# Patient Record
Sex: Male | Born: 1955 | Race: White | Hispanic: No | Marital: Married | Smoking: Never smoker
Health system: Southern US, Community
[De-identification: ages and names within clinical notes are randomized; demographics above are authoritative.]

## PROBLEM LIST (undated history)

## (undated) DIAGNOSIS — E785 Hyperlipidemia, unspecified: Secondary | ICD-10-CM

## (undated) DIAGNOSIS — I10 Essential (primary) hypertension: Secondary | ICD-10-CM

## (undated) DIAGNOSIS — G4730 Sleep apnea, unspecified: Secondary | ICD-10-CM

## (undated) HISTORY — PX: COLONOSCOPY: SHX174

## (undated) HISTORY — PX: ADENOIDECTOMY: SUR15

---

## 1999-05-17 ENCOUNTER — Encounter: Payer: Self-pay | Admitting: Emergency Medicine

## 1999-05-17 ENCOUNTER — Ambulatory Visit (HOSPITAL_COMMUNITY): Admission: RE | Admit: 1999-05-17 | Discharge: 1999-05-17 | Payer: Self-pay | Admitting: Emergency Medicine

## 2002-08-26 ENCOUNTER — Encounter: Admission: RE | Admit: 2002-08-26 | Discharge: 2002-08-26 | Payer: Self-pay | Admitting: Emergency Medicine

## 2002-08-26 ENCOUNTER — Encounter: Payer: Self-pay | Admitting: Emergency Medicine

## 2003-10-08 ENCOUNTER — Emergency Department (HOSPITAL_COMMUNITY): Admission: AD | Admit: 2003-10-08 | Discharge: 2003-10-08 | Payer: Self-pay | Admitting: Family Medicine

## 2004-05-30 ENCOUNTER — Encounter: Admission: RE | Admit: 2004-05-30 | Discharge: 2004-05-30 | Payer: Self-pay | Admitting: Emergency Medicine

## 2004-06-14 ENCOUNTER — Encounter: Admission: RE | Admit: 2004-06-14 | Discharge: 2004-06-14 | Payer: Self-pay | Admitting: Emergency Medicine

## 2004-07-20 ENCOUNTER — Encounter: Admission: RE | Admit: 2004-07-20 | Discharge: 2004-08-30 | Payer: Self-pay | Admitting: Orthopedic Surgery

## 2005-04-18 ENCOUNTER — Encounter: Admission: RE | Admit: 2005-04-18 | Discharge: 2005-07-17 | Payer: Self-pay | Admitting: Orthopedic Surgery

## 2005-06-18 ENCOUNTER — Encounter: Admission: RE | Admit: 2005-06-18 | Discharge: 2005-06-18 | Payer: Self-pay | Admitting: Emergency Medicine

## 2008-01-27 ENCOUNTER — Encounter: Admission: RE | Admit: 2008-01-27 | Discharge: 2008-02-26 | Payer: Self-pay | Admitting: Emergency Medicine

## 2008-02-17 ENCOUNTER — Encounter: Admission: RE | Admit: 2008-02-17 | Discharge: 2008-02-17 | Payer: Self-pay | Admitting: Emergency Medicine

## 2009-03-25 ENCOUNTER — Ambulatory Visit (HOSPITAL_COMMUNITY): Admission: RE | Admit: 2009-03-25 | Discharge: 2009-03-25 | Payer: Self-pay | Admitting: Family Medicine

## 2011-01-07 ENCOUNTER — Encounter: Payer: Self-pay | Admitting: Emergency Medicine

## 2012-01-21 ENCOUNTER — Ambulatory Visit
Admission: RE | Admit: 2012-01-21 | Discharge: 2012-01-21 | Disposition: A | Discharge status: Home / Self Care | Payer: BC Managed Care – PPO | Source: Ambulatory Visit | Attending: Family Medicine | Admitting: Family Medicine

## 2012-01-21 ENCOUNTER — Other Ambulatory Visit: Payer: Self-pay | Admitting: Family Medicine

## 2012-01-21 DIAGNOSIS — R109 Unspecified abdominal pain: Secondary | ICD-10-CM

## 2012-01-22 ENCOUNTER — Other Ambulatory Visit: Payer: Self-pay | Admitting: Family Medicine

## 2012-01-22 DIAGNOSIS — R1011 Right upper quadrant pain: Secondary | ICD-10-CM

## 2012-01-24 ENCOUNTER — Ambulatory Visit
Admission: RE | Admit: 2012-01-24 | Discharge: 2012-01-24 | Disposition: A | Discharge status: Home / Self Care | Payer: BC Managed Care – PPO | Source: Ambulatory Visit | Attending: Family Medicine | Admitting: Family Medicine

## 2012-01-24 DIAGNOSIS — R1011 Right upper quadrant pain: Secondary | ICD-10-CM

## 2012-10-16 ENCOUNTER — Ambulatory Visit: Payer: BC Managed Care – PPO | Attending: Orthopedic Surgery | Admitting: Physical Therapy

## 2012-10-16 ENCOUNTER — Ambulatory Visit: Payer: BC Managed Care – PPO | Admitting: Physical Therapy

## 2012-10-16 DIAGNOSIS — IMO0001 Reserved for inherently not codable concepts without codable children: Secondary | ICD-10-CM | POA: Insufficient documentation

## 2012-10-16 DIAGNOSIS — M25669 Stiffness of unspecified knee, not elsewhere classified: Secondary | ICD-10-CM | POA: Insufficient documentation

## 2012-10-16 DIAGNOSIS — M25569 Pain in unspecified knee: Secondary | ICD-10-CM | POA: Insufficient documentation

## 2012-10-20 ENCOUNTER — Ambulatory Visit: Payer: BC Managed Care – PPO | Admitting: Physical Therapy

## 2013-03-22 ENCOUNTER — Encounter (HOSPITAL_COMMUNITY): Payer: Self-pay | Admitting: Emergency Medicine

## 2013-03-22 ENCOUNTER — Emergency Department (HOSPITAL_COMMUNITY)
Admission: EM | Admit: 2013-03-22 | Discharge: 2013-03-22 | Disposition: A | Discharge status: Home / Self Care | Payer: BC Managed Care – PPO | Attending: Emergency Medicine | Admitting: Emergency Medicine

## 2013-03-22 DIAGNOSIS — G4733 Obstructive sleep apnea (adult) (pediatric): Secondary | ICD-10-CM | POA: Insufficient documentation

## 2013-03-22 DIAGNOSIS — J029 Acute pharyngitis, unspecified: Secondary | ICD-10-CM

## 2013-03-22 DIAGNOSIS — Z79899 Other long term (current) drug therapy: Secondary | ICD-10-CM | POA: Insufficient documentation

## 2013-03-22 DIAGNOSIS — Z7982 Long term (current) use of aspirin: Secondary | ICD-10-CM | POA: Insufficient documentation

## 2013-03-22 DIAGNOSIS — E785 Hyperlipidemia, unspecified: Secondary | ICD-10-CM | POA: Insufficient documentation

## 2013-03-22 DIAGNOSIS — I10 Essential (primary) hypertension: Secondary | ICD-10-CM | POA: Insufficient documentation

## 2013-03-22 DIAGNOSIS — R509 Fever, unspecified: Secondary | ICD-10-CM | POA: Insufficient documentation

## 2013-03-22 HISTORY — DX: Essential (primary) hypertension: I10

## 2013-03-22 HISTORY — DX: Sleep apnea, unspecified: G47.30

## 2013-03-22 HISTORY — DX: Hyperlipidemia, unspecified: E78.5

## 2013-03-22 LAB — RAPID STREP SCREEN (MED CTR MEBANE ONLY): Streptococcus, Group A Screen (Direct): NEGATIVE

## 2013-03-22 MED ORDER — ACETAMINOPHEN 500 MG PO TABS
1000.0000 mg | ORAL_TABLET | Freq: Once | ORAL | Status: AC
  Administered 2013-03-22: 1000 mg via ORAL
  Filled 2013-03-22: qty 2

## 2013-03-22 NOTE — ED Notes (Signed)
Pt discharged to home with family. NAD.  

## 2013-03-22 NOTE — ED Notes (Signed)
Pt presents to ED today with c/o sore throat that started last night. NAD.

## 2013-03-22 NOTE — ED Provider Notes (Signed)
History     CSN: [id number]  Arrival date & time 03/22/13  0741   First MD Initiated Contact with Patient 03/22/13 [phone]      Chief Complaint  Patient presents with  . Sore Throat    (Consider location/radiation/quality/duration/timing/severity/associated sxs/prior treatment) Patient is a 57 y.o. male presenting with pharyngitis. The history is provided by the patient. No language interpreter was used.  Sore Throat Associated symptoms include a fever ( 100.4 last night) and a sore throat. Pertinent negatives include no chest pain, chills, congestion, coughing, diaphoresis, fatigue, nausea, neck pain, rash or vomiting.  Pt states he developed a sore throat yesterday.  States it hurts to swallow food.  Took his temp twice yesterday, once was normal, the 2nd time it was 100.4.  Did take 2 tablets of ibuprofen twice yesterday with some relief.  Pain began at 6/10 backed off to 2/10, now back to 4/10 today.  Pt was able to eat and drink just fine last night.  States throat pain kept him up last night.  Denies difficulty breathing or swallowing, just painful to swallow solids.  Also c/o mild headache.  Denies n/v/d. Mentions some people have been sick at work recently.  No PMH of seasonal allergies or asthma.    Past Medical History  Diagnosis Date  . Hypertension   . Sleep apnea   . Hyperlipemia     Past Surgical History  Procedure Laterality Date  . Adnoids      History reviewed. No pertinent family history.  History  Substance Use Topics  . Smoking status: Not on file  . Smokeless tobacco: Not on file  . Alcohol Use: Yes     Comment: 5 days a week liquor      Review of Systems  Constitutional: Positive for fever ( 100.4 last night). Negative for chills, diaphoresis and fatigue.  HENT: Positive for sore throat and sneezing. Negative for ear pain, congestion, rhinorrhea, drooling, neck pain, voice change, postnasal drip and sinus pressure.   Eyes: Negative for redness and  itching.  Respiratory: Negative for cough, chest tightness, shortness of breath and stridor.   Cardiovascular: Negative for chest pain.  Gastrointestinal: Negative for nausea, vomiting and diarrhea.  Skin: Negative for rash.    Allergies  Review of patient's allergies indicates no known allergies.  Home Medications   Current Outpatient Rx  Name  Route  Sig  Dispense  Refill  . amLODipine (NORVASC) 10 MG tablet   Oral   Take 10 mg by mouth daily.         Marland Kitchen aspirin 81 MG tablet   Oral   Take 81 mg by mouth every evening.         Marland Kitchen atenolol (TENORMIN) 25 MG tablet   Oral   Take 25 mg by mouth daily.         . Fiber CAPS   Oral   Take 4 capsules by mouth every evening.         . fish oil-omega-3 fatty acids 1000 MG capsule   Oral   Take 2 g by mouth daily.         Marland Kitchen losartan (COZAAR) 100 MG tablet   Oral   Take 100 mg by mouth daily.         . pravastatin (PRAVACHOL) 40 MG tablet   Oral   Take 40 mg by mouth daily.         . SAW PALMETTO, SERENOA REPENS, PO   Oral  Take 1 tablet by mouth every evening.           BP 146/75  Pulse 67  Temp(Src) 98.4 F (36.9 C) (Oral)  Ht 6\' 2"  (1.88 m)  Wt 270 lb (122.471 kg)  BMI 34.65 kg/m2  SpO2 100%  Physical Exam  Nursing note and vitals reviewed. Constitutional: He appears well-developed and well-nourished. No distress.  HENT:  Head: Normocephalic and atraumatic.  Mouth/Throat: Uvula is midline and mucous membranes are normal. Posterior oropharyngeal edema and posterior oropharyngeal erythema present. No oropharyngeal exudate or tonsillar abscesses.  Eyes: Conjunctivae are normal. No scleral icterus.  Neck: Normal range of motion. Neck supple.  Cardiovascular: Normal rate and regular rhythm.   Pulmonary/Chest: Effort normal and breath sounds normal. No respiratory distress. He has no wheezes. He has no rales. He exhibits no tenderness.  Abdominal: Soft. Bowel sounds are normal. He exhibits no  distension. There is no tenderness.  Musculoskeletal: Normal range of motion.  Lymphadenopathy:    He has cervical adenopathy (left anterior cervical  ).  Neurological: He is alert.  Skin: Skin is warm and dry. He is not diaphoretic.  Psychiatric: He has a normal mood and affect. His behavior is normal.    ED Course  Procedures (including critical care time)  Labs Reviewed  RAPID STREP SCREEN   No results found.   1. Acute viral pharyngitis       MDM  Pt c/o sore throat, was 6/10 yesterday, down to a 4/10 today, also c/o mild headache and reported fever of 100.4 last night.  Ibuprofen yesterday did help.  Will obtain rapid strep.   Gave pt acetaminophen in ED for pain.   Rapid strep: negative   Believe this is viral pharyngitis.  Will have pt take OTC acetaminophen or ibuprofen.  Also suggested salt water gargle and chloraseptic spray.  Provider pt with [facility] contact info to establish care with PCP.  F/u with PCP or urgent care if not improving in next few days.  Return to ED if unable to swallow liquids or solids or if difficulty breathing.  Vitals: unremarkable. Discharged in stable condition.    Discussed pt with attending during ED encounter.         Junius Finner, PA-C 03/22/13 [phone]

## 2013-03-25 NOTE — ED Provider Notes (Signed)
Medical screening examination/treatment/procedure(s) were performed by non-physician practitioner and as supervising physician I was immediately available for consultation/collaboration.   Suzi Roots, MD 03/25/13 2139

## 2015-02-25 ENCOUNTER — Other Ambulatory Visit: Payer: Self-pay | Admitting: Family Medicine

## 2015-02-25 DIAGNOSIS — R1011 Right upper quadrant pain: Secondary | ICD-10-CM

## 2015-03-25 ENCOUNTER — Ambulatory Visit
Admission: RE | Admit: 2015-03-25 | Discharge: 2015-03-25 | Disposition: A | Discharge status: Home / Self Care | Payer: BC Managed Care – PPO | Source: Ambulatory Visit | Attending: Family Medicine | Admitting: Family Medicine

## 2015-03-25 ENCOUNTER — Other Ambulatory Visit: Payer: Self-pay | Admitting: Family Medicine

## 2015-03-25 DIAGNOSIS — R1011 Right upper quadrant pain: Secondary | ICD-10-CM

## 2016-10-08 ENCOUNTER — Other Ambulatory Visit: Payer: Self-pay | Admitting: Family Medicine

## 2016-10-08 DIAGNOSIS — M5442 Lumbago with sciatica, left side: Secondary | ICD-10-CM

## 2016-10-12 ENCOUNTER — Ambulatory Visit
Admission: RE | Admit: 2016-10-12 | Discharge: 2016-10-12 | Disposition: A | Discharge status: Home / Self Care | Payer: BC Managed Care – PPO | Source: Ambulatory Visit | Attending: Family Medicine | Admitting: Family Medicine

## 2016-10-12 DIAGNOSIS — M5442 Lumbago with sciatica, left side: Secondary | ICD-10-CM

## 2016-10-17 ENCOUNTER — Other Ambulatory Visit: Payer: BC Managed Care – PPO

## 2016-10-20 ENCOUNTER — Other Ambulatory Visit: Payer: BC Managed Care – PPO

## 2017-09-13 ENCOUNTER — Other Ambulatory Visit: Payer: Self-pay | Admitting: Neurological Surgery

## 2017-10-07 ENCOUNTER — Encounter (HOSPITAL_COMMUNITY): Payer: Self-pay

## 2017-10-07 NOTE — Pre-Procedure Instructions (Signed)
Douglas BaileyJoseph Roberts  10/07/2017      [id number] - [place], [place] - [phone] [place] [address] Phone: [phone] Fax: [phone]    Your procedure is scheduled on Tuesday October 30.  [facility] at 10:00 A.M.  Call this number if you have problems the morning of surgery:  [phone]   Remember:  Do not eat food or drink liquids after midnight.  CONTINUE TAKING all medications as prescribed unless instructed otherwise.    Take these medicines the morning of surgery with A SIP OF WATER: NONE  7 days prior to surgery STOP taking any Aspirin (unless otherwise instructed by your surgeon), Aleve, Naproxen, Ibuprofen, Motrin, Advil, Goody's, BC's, all herbal medications, fish oil, and all vitamins    Do not wear jewelry, make-up or nail polish.  Do not wear lotions, powders, or perfumes, or deoderant.  Do not shave 48 hours prior to surgery.  Men may shave face and neck.  Do not bring valuables to the hospital.  [facility] is not responsible for any belongings or valuables.  Contacts, dentures or bridgework may not be worn into surgery.  Leave your suitcase in the car.  After surgery it may be brought to your room.  For patients admitted to the hospital, discharge time will be determined by your treatment team.  Patients discharged the day of surgery will not be allowed to drive home.   Special instructions:    [facility]- Preparing For Surgery  Before surgery, you can play an important role. Because skin is not sterile, your skin needs to be as free of germs as possible. You can reduce the number of germs on your skin by washing with CHG (chlorahexidine gluconate) Soap before surgery.  CHG is an antiseptic cleaner which kills germs and bonds with the skin to continue killing germs even after washing.  Please do not use if you have an allergy to CHG or antibacterial soaps. If your skin becomes reddened/irritated  stop using the CHG.  Do not shave (including legs and underarms) for at least 48 hours prior to first CHG shower. It is OK to shave your face.  Please follow these instructions carefully.   1. Shower the NIGHT BEFORE SURGERY and the MORNING OF SURGERY with CHG.   2. If you chose to wash your hair, wash your hair first as usual with your normal shampoo.  3. After you shampoo, rinse your hair and body thoroughly to remove the shampoo.  4. Use CHG as you would any other liquid soap. You can apply CHG directly to the skin and wash gently with a scrungie or a clean washcloth.   5. Apply the CHG Soap to your body ONLY FROM THE NECK DOWN.  Do not use on open wounds or open sores. Avoid contact with your eyes, ears, mouth and genitals (private parts). Wash Face and genitals (private parts)  with your normal soap.  6. Wash thoroughly, paying special attention to the area where your surgery will be performed.  7. Thoroughly rinse your body with warm water from the neck down.  8. DO NOT shower/wash with your normal soap after using and rinsing off the CHG Soap.  9. Pat yourself dry with a CLEAN TOWEL.  10. Wear CLEAN PAJAMAS to bed the night before surgery, wear comfortable clothes the morning of surgery  11. Place CLEAN SHEETS on your bed the night of your first shower and DO NOT SLEEP  WITH PETS.    Day of Surgery: Do not apply any deodorants/lotions. Please wear clean clothes to the hospital/surgery center.      Please read over the following fact sheets that you were given. Coughing and Deep Breathing and MRSA Information

## 2017-10-08 ENCOUNTER — Encounter (HOSPITAL_COMMUNITY): Payer: Self-pay

## 2017-10-08 ENCOUNTER — Encounter (HOSPITAL_COMMUNITY)
Admission: RE | Admit: 2017-10-08 | Discharge: 2017-10-08 | Disposition: A | Discharge status: Home / Self Care | Payer: BC Managed Care – PPO | Source: Ambulatory Visit | Attending: Neurological Surgery | Admitting: Neurological Surgery

## 2017-10-08 DIAGNOSIS — E785 Hyperlipidemia, unspecified: Secondary | ICD-10-CM | POA: Diagnosis not present

## 2017-10-08 DIAGNOSIS — Z01812 Encounter for preprocedural laboratory examination: Secondary | ICD-10-CM | POA: Insufficient documentation

## 2017-10-08 DIAGNOSIS — G4733 Obstructive sleep apnea (adult) (pediatric): Secondary | ICD-10-CM | POA: Insufficient documentation

## 2017-10-08 DIAGNOSIS — Z01810 Encounter for preprocedural cardiovascular examination: Secondary | ICD-10-CM | POA: Insufficient documentation

## 2017-10-08 DIAGNOSIS — I10 Essential (primary) hypertension: Secondary | ICD-10-CM | POA: Insufficient documentation

## 2017-10-08 DIAGNOSIS — M48062 Spinal stenosis, lumbar region with neurogenic claudication: Secondary | ICD-10-CM | POA: Diagnosis not present

## 2017-10-08 LAB — CBC
HEMATOCRIT: 45.2 % (ref 39.0–52.0)
HEMOGLOBIN: 15.6 g/dL (ref 13.0–17.0)
MCH: 32.8 pg (ref 26.0–34.0)
MCHC: 34.5 g/dL (ref 30.0–36.0)
MCV: 95.2 fL (ref 78.0–100.0)
Platelets: 193 10*3/uL (ref 150–400)
RBC: 4.75 MIL/uL (ref 4.22–5.81)
RDW: 13.1 % (ref 11.5–15.5)
WBC: 6.3 10*3/uL (ref 4.0–10.5)

## 2017-10-08 LAB — BASIC METABOLIC PANEL
ANION GAP: 13 (ref 5–15)
BUN: 8 mg/dL (ref 6–20)
CO2: 24 mmol/L (ref 22–32)
Calcium: 9.9 mg/dL (ref 8.9–10.3)
Chloride: 100 mmol/L — ABNORMAL LOW (ref 101–111)
Creatinine, Ser: 1.1 mg/dL (ref 0.61–1.24)
GLUCOSE: 131 mg/dL — AB (ref 65–99)
POTASSIUM: 4 mmol/L (ref 3.5–5.1)
Sodium: 137 mmol/L (ref 135–145)

## 2017-10-08 LAB — SURGICAL PCR SCREEN
MRSA, PCR: NEGATIVE
Staphylococcus aureus: NEGATIVE

## 2017-10-08 MED ORDER — CHLORHEXIDINE GLUCONATE CLOTH 2 % EX PADS: 6.0000 | MEDICATED_PAD | Freq: Once | CUTANEOUS | Status: DC

## 2017-10-08 NOTE — Progress Notes (Signed)
PCP - Douglas SenegalKevin Little [employer] Cardiologist - denies  EKG - 10/08/2017  Stress Test - [facility], pt reports normal test done 7 years ago  Pt reports having sleep apnea but does not use CPAP machine.   Patient denies shortness of breath, fever, cough and chest pain at PAT appointment   Patient verbalized understanding of instructions that were given to them at the PAT appointment. Patient was also instructed that they will need to review over the PAT instructions again at home before surgery.

## 2017-10-10 NOTE — Progress Notes (Signed)
Anesthesia Chart Review: Patient is a 61 year old male scheduled for bilateral L2-3, L3-4, L4-5 laminectomy/foraminotomy on 10/15/17 by Dr. Barnett AbuHenry Elsner.  History includes never smoker, HTN, HLD, OSA (no CPAP), adenoidectomy. BMI is consistent with obesity.  PCP is Dr. Catha GosselinKevin Little.   Meds include ASA 81 mg, atenolol, doxazosin, fish oil, pravastatin, saw palmetto.  BP (!) 146/83   Temp 36.9 C   Resp 20   Ht 6\' 2"  (1.88 m)   Wt 255 lb 11.2 oz (116 kg)   SpO2 95%   BMI 32.83 kg/m   EKG 10/08/17: NSR, non-specific T wave abnormality. Currently no comparison tracing.   He was referred to cardiologist Dr. Donato SchultzMark Skains (at [facility], now [facility]) in 2011 for abnormal EKG and atypical chest pain. By notes his EKG "showed loss of lateral T waves.Marland Kitchen.Marland Kitchen.Q waves in lead one and aVL which may be indicative of a lateral infarction." He had a nuclear stress test read on 01/18/10 that showed, "Low-risk, no ischemia, no infarct...no evidence of lateral infarction on perfusion imaging. Normal EF." (These records are scanned under the Media tab, 01/12/10.)  Interpreting cardiologist felt T wave changes were non-specific. Previous history of abnormal EKG although I don't currently have an old tracing. He denied SOB, fever, cough, chest pain at PAT. If no acute changes and remains asymptomatic from a CV standpoint then I would anticipate that he can proceed as planned.  Velna Ochsllison Opie Fanton, PA-C [facility]Anesthesiology Phone [phone] 10/10/2017 4:54 PM

## 2017-10-14 MED ORDER — CEFAZOLIN SODIUM-DEXTROSE 2-4 GM/100ML-% IV SOLN
2.0000 g | INTRAVENOUS | Status: AC
  Administered 2017-10-15: 2 g via INTRAVENOUS
  Filled 2017-10-14: qty 100

## 2017-10-15 ENCOUNTER — Encounter (HOSPITAL_COMMUNITY): Payer: Self-pay | Admitting: Certified Registered Nurse Anesthetist

## 2017-10-15 ENCOUNTER — Observation Stay (HOSPITAL_COMMUNITY)
Admission: RE | Admit: 2017-10-15 | Discharge: 2017-10-16 | Disposition: A | Discharge status: Home / Self Care | Payer: BC Managed Care – PPO | Source: Ambulatory Visit | Attending: Neurological Surgery | Admitting: Neurological Surgery

## 2017-10-15 ENCOUNTER — Ambulatory Visit (HOSPITAL_COMMUNITY): Payer: BC Managed Care – PPO | Admitting: Emergency Medicine

## 2017-10-15 ENCOUNTER — Ambulatory Visit (HOSPITAL_COMMUNITY): Payer: BC Managed Care – PPO | Admitting: Certified Registered Nurse Anesthetist

## 2017-10-15 ENCOUNTER — Encounter (HOSPITAL_COMMUNITY)
Admission: RE | Disposition: A | Discharge status: Home / Self Care | Payer: Self-pay | Source: Ambulatory Visit | Attending: Neurological Surgery

## 2017-10-15 ENCOUNTER — Ambulatory Visit (HOSPITAL_COMMUNITY): Payer: BC Managed Care – PPO

## 2017-10-15 DIAGNOSIS — G4730 Sleep apnea, unspecified: Secondary | ICD-10-CM | POA: Diagnosis not present

## 2017-10-15 DIAGNOSIS — R2689 Other abnormalities of gait and mobility: Secondary | ICD-10-CM | POA: Diagnosis not present

## 2017-10-15 DIAGNOSIS — Z419 Encounter for procedure for purposes other than remedying health state, unspecified: Secondary | ICD-10-CM

## 2017-10-15 DIAGNOSIS — Z79899 Other long term (current) drug therapy: Secondary | ICD-10-CM | POA: Insufficient documentation

## 2017-10-15 DIAGNOSIS — I10 Essential (primary) hypertension: Secondary | ICD-10-CM | POA: Diagnosis not present

## 2017-10-15 DIAGNOSIS — Z7982 Long term (current) use of aspirin: Secondary | ICD-10-CM | POA: Diagnosis not present

## 2017-10-15 DIAGNOSIS — M4726 Other spondylosis with radiculopathy, lumbar region: Secondary | ICD-10-CM | POA: Insufficient documentation

## 2017-10-15 DIAGNOSIS — M48062 Spinal stenosis, lumbar region with neurogenic claudication: Secondary | ICD-10-CM | POA: Diagnosis present

## 2017-10-15 HISTORY — PX: LUMBAR LAMINECTOMY/DECOMPRESSION MICRODISCECTOMY: SHX5026

## 2017-10-15 SURGERY — LUMBAR LAMINECTOMY/DECOMPRESSION MICRODISCECTOMY 3 LEVELS
Anesthesia: General | Site: Back | Laterality: Bilateral

## 2017-10-15 MED ORDER — HYDROCODONE-ACETAMINOPHEN 5-325 MG PO TABS
2.0000 | ORAL_TABLET | ORAL | Status: DC | PRN
  Administered 2017-10-15 – 2017-10-16 (×4): 2 via ORAL
  Filled 2017-10-15 (×4): qty 2

## 2017-10-15 MED ORDER — DOCUSATE SODIUM 100 MG PO CAPS
100.0000 mg | ORAL_CAPSULE | Freq: Two times a day (BID) | ORAL | Status: DC
  Administered 2017-10-15 – 2017-10-16 (×2): 100 mg via ORAL
  Filled 2017-10-15 (×2): qty 1

## 2017-10-15 MED ORDER — BUPIVACAINE HCL (PF) 0.5 % IJ SOLN
INTRAMUSCULAR | Status: AC
  Filled 2017-10-15: qty 30

## 2017-10-15 MED ORDER — FENTANYL CITRATE (PF) 100 MCG/2ML IJ SOLN: 25.0000 ug | INTRAMUSCULAR | Status: DC | PRN

## 2017-10-15 MED ORDER — KETOROLAC TROMETHAMINE 30 MG/ML IJ SOLN
INTRAMUSCULAR | Status: DC | PRN
  Administered 2017-10-15: 30 mg via INTRAVENOUS

## 2017-10-15 MED ORDER — DOXAZOSIN MESYLATE 2 MG PO TABS
2.0000 mg | ORAL_TABLET | Freq: Every day | ORAL | Status: DC
  Administered 2017-10-15: 2 mg via ORAL
  Filled 2017-10-15 (×3): qty 1

## 2017-10-15 MED ORDER — KETOROLAC TROMETHAMINE 15 MG/ML IJ SOLN
15.0000 mg | Freq: Four times a day (QID) | INTRAMUSCULAR | Status: DC
Start: 2017-10-15 — End: 2017-10-16
  Administered 2017-10-15 – 2017-10-16 (×3): 15 mg via INTRAVENOUS
  Filled 2017-10-15 (×3): qty 1

## 2017-10-15 MED ORDER — DEXTROSE 5 % IV SOLN
500.0000 mg | Freq: Four times a day (QID) | INTRAVENOUS | Status: DC | PRN
  Filled 2017-10-15: qty 5

## 2017-10-15 MED ORDER — MENTHOL 3 MG MT LOZG: 1.0000 | LOZENGE | OROMUCOSAL | Status: DC | PRN

## 2017-10-15 MED ORDER — FENTANYL CITRATE (PF) 100 MCG/2ML IJ SOLN
INTRAMUSCULAR | Status: DC | PRN
  Administered 2017-10-15 (×2): 50 ug via INTRAVENOUS
  Administered 2017-10-15: 100 ug via INTRAVENOUS
  Administered 2017-10-15: 150 ug via INTRAVENOUS
  Administered 2017-10-15: 50 ug via INTRAVENOUS

## 2017-10-15 MED ORDER — ACETAMINOPHEN 325 MG PO TABS: 650.0000 mg | ORAL_TABLET | ORAL | Status: DC | PRN

## 2017-10-15 MED ORDER — EPHEDRINE SULFATE 50 MG/ML IJ SOLN
INTRAMUSCULAR | Status: DC | PRN
  Administered 2017-10-15 (×2): 10 mg via INTRAVENOUS

## 2017-10-15 MED ORDER — OXYCODONE HCL 5 MG/5ML PO SOLN: 5.0000 mg | Freq: Once | ORAL | Status: DC | PRN

## 2017-10-15 MED ORDER — OXYCODONE HCL 5 MG PO TABS: 5.0000 mg | ORAL_TABLET | Freq: Once | ORAL | Status: DC | PRN

## 2017-10-15 MED ORDER — CEFAZOLIN SODIUM-DEXTROSE 2-4 GM/100ML-% IV SOLN
2.0000 g | Freq: Three times a day (TID) | INTRAVENOUS | Status: AC
  Administered 2017-10-15 – 2017-10-16 (×2): 2 g via INTRAVENOUS
  Filled 2017-10-15 (×2): qty 100

## 2017-10-15 MED ORDER — LIDOCAINE-EPINEPHRINE 1 %-1:100000 IJ SOLN
INTRAMUSCULAR | Status: AC
  Filled 2017-10-15: qty 1

## 2017-10-15 MED ORDER — PHENOL 1.4 % MT LIQD: 1.0000 | OROMUCOSAL | Status: DC | PRN

## 2017-10-15 MED ORDER — MIDAZOLAM HCL 2 MG/2ML IJ SOLN
INTRAMUSCULAR | Status: AC
  Filled 2017-10-15: qty 2

## 2017-10-15 MED ORDER — SUGAMMADEX SODIUM 200 MG/2ML IV SOLN
INTRAVENOUS | Status: DC | PRN
  Administered 2017-10-15: 200 mg via INTRAVENOUS

## 2017-10-15 MED ORDER — SODIUM CHLORIDE 0.9% FLUSH: 3.0000 mL | INTRAVENOUS | Status: DC | PRN

## 2017-10-15 MED ORDER — DEXAMETHASONE SODIUM PHOSPHATE 10 MG/ML IJ SOLN
INTRAMUSCULAR | Status: DC | PRN
  Administered 2017-10-15: 10 mg via INTRAVENOUS

## 2017-10-15 MED ORDER — DOCUSATE SODIUM 100 MG PO CAPS: 100.0000 mg | ORAL_CAPSULE | Freq: Every day | ORAL | Status: DC

## 2017-10-15 MED ORDER — FIBER PO CAPS: 2.0000 | ORAL_CAPSULE | Freq: Two times a day (BID) | ORAL | Status: DC

## 2017-10-15 MED ORDER — FENTANYL CITRATE (PF) 250 MCG/5ML IJ SOLN
INTRAMUSCULAR | Status: AC
  Filled 2017-10-15: qty 5

## 2017-10-15 MED ORDER — POLYETHYLENE GLYCOL 3350 17 G PO PACK: 17.0000 g | PACK | Freq: Every day | ORAL | Status: DC | PRN

## 2017-10-15 MED ORDER — LACTATED RINGERS IV SOLN
INTRAVENOUS | Status: DC
Start: 2017-10-15 — End: 2017-10-16
  Administered 2017-10-15: 13:00:00 via INTRAVENOUS
  Administered 2017-10-15: 50 mL/h via INTRAVENOUS

## 2017-10-15 MED ORDER — PROPOFOL 10 MG/ML IV BOLUS
INTRAVENOUS | Status: DC | PRN
  Administered 2017-10-15: 150 mg via INTRAVENOUS
  Administered 2017-10-15: 50 mg via INTRAVENOUS

## 2017-10-15 MED ORDER — KETAMINE HCL-SODIUM CHLORIDE 100-0.9 MG/10ML-% IV SOSY
PREFILLED_SYRINGE | INTRAVENOUS | Status: AC
  Filled 2017-10-15: qty 10

## 2017-10-15 MED ORDER — ACETAMINOPHEN 10 MG/ML IV SOLN
INTRAVENOUS | Status: AC
  Filled 2017-10-15: qty 100

## 2017-10-15 MED ORDER — KETAMINE HCL 100 MG/ML IJ SOLN
INTRAMUSCULAR | Status: AC
  Filled 2017-10-15: qty 1

## 2017-10-15 MED ORDER — ONDANSETRON HCL 4 MG/2ML IJ SOLN: 4.0000 mg | Freq: Four times a day (QID) | INTRAMUSCULAR | Status: DC | PRN

## 2017-10-15 MED ORDER — LIDOCAINE HCL (CARDIAC) 20 MG/ML IV SOLN
INTRAVENOUS | Status: DC | PRN
  Administered 2017-10-15: 100 mg via INTRAVENOUS

## 2017-10-15 MED ORDER — SODIUM CHLORIDE 0.9% FLUSH
3.0000 mL | Freq: Two times a day (BID) | INTRAVENOUS | Status: DC
Start: 2017-10-15 — End: 2017-10-16
  Administered 2017-10-15: 3 mL via INTRAVENOUS

## 2017-10-15 MED ORDER — HYDROCODONE-ACETAMINOPHEN 5-325 MG PO TABS: 1.0000 | ORAL_TABLET | ORAL | Status: DC | PRN

## 2017-10-15 MED ORDER — PRAVASTATIN SODIUM 40 MG PO TABS
40.0000 mg | ORAL_TABLET | Freq: Every day | ORAL | Status: DC
  Administered 2017-10-15: 40 mg via ORAL
  Filled 2017-10-15: qty 1

## 2017-10-15 MED ORDER — THROMBIN (RECOMBINANT) 5000 UNITS EX SOLR
CUTANEOUS | Status: AC
  Filled 2017-10-15: qty 15000

## 2017-10-15 MED ORDER — 0.9 % SODIUM CHLORIDE (POUR BTL) OPTIME
TOPICAL | Status: DC | PRN
  Administered 2017-10-15: 1000 mL

## 2017-10-15 MED ORDER — THROMBIN (RECOMBINANT) 20000 UNITS EX SOLR
CUTANEOUS | Status: AC
  Filled 2017-10-15: qty 20000

## 2017-10-15 MED ORDER — ROCURONIUM BROMIDE 100 MG/10ML IV SOLN
INTRAVENOUS | Status: DC | PRN
  Administered 2017-10-15: 30 mg via INTRAVENOUS
  Administered 2017-10-15: 70 mg via INTRAVENOUS

## 2017-10-15 MED ORDER — FLEET ENEMA 7-19 GM/118ML RE ENEM: 1.0000 | ENEMA | Freq: Once | RECTAL | Status: DC | PRN

## 2017-10-15 MED ORDER — LIDOCAINE-EPINEPHRINE 1 %-1:100000 IJ SOLN
INTRAMUSCULAR | Status: DC | PRN
  Administered 2017-10-15: 5 mL

## 2017-10-15 MED ORDER — CALCIUM POLYCARBOPHIL 625 MG PO TABS
625.0000 mg | ORAL_TABLET | Freq: Two times a day (BID) | ORAL | Status: DC
  Administered 2017-10-15: 625 mg via ORAL
  Filled 2017-10-15 (×2): qty 1

## 2017-10-15 MED ORDER — KETAMINE HCL 10 MG/ML IJ SOLN
INTRAMUSCULAR | Status: DC | PRN
  Administered 2017-10-15: 60 mg via INTRAVENOUS
  Administered 2017-10-15 (×4): 10 mg via INTRAVENOUS

## 2017-10-15 MED ORDER — DIPHENHYDRAMINE HCL 25 MG PO CAPS
25.0000 mg | ORAL_CAPSULE | Freq: Every evening | ORAL | Status: DC | PRN
  Administered 2017-10-15: 50 mg via ORAL
  Filled 2017-10-15: qty 2

## 2017-10-15 MED ORDER — PROPOFOL 10 MG/ML IV BOLUS
INTRAVENOUS | Status: AC
  Filled 2017-10-15: qty 20

## 2017-10-15 MED ORDER — BACITRACIN 50000 UNITS IM SOLR
INTRAMUSCULAR | Status: DC | PRN
  Administered 2017-10-15: 12:00:00

## 2017-10-15 MED ORDER — ACETAMINOPHEN 160 MG/5ML PO SOLN: 325.0000 mg | ORAL | Status: DC | PRN

## 2017-10-15 MED ORDER — ACETAMINOPHEN 650 MG RE SUPP: 650.0000 mg | RECTAL | Status: DC | PRN

## 2017-10-15 MED ORDER — ALUM & MAG HYDROXIDE-SIMETH 200-200-20 MG/5ML PO SUSP: 30.0000 mL | Freq: Four times a day (QID) | ORAL | Status: DC | PRN

## 2017-10-15 MED ORDER — ATENOLOL 25 MG PO TABS
25.0000 mg | ORAL_TABLET | Freq: Every day | ORAL | Status: DC
  Administered 2017-10-15: 25 mg via ORAL
  Filled 2017-10-15 (×3): qty 1

## 2017-10-15 MED ORDER — ONDANSETRON HCL 4 MG/2ML IJ SOLN
INTRAMUSCULAR | Status: DC | PRN
  Administered 2017-10-15: 4 mg via INTRAVENOUS

## 2017-10-15 MED ORDER — THROMBIN (RECOMBINANT) 5000 UNITS EX SOLR
OROMUCOSAL | Status: DC | PRN
  Administered 2017-10-15: 12:00:00 via TOPICAL

## 2017-10-15 MED ORDER — MIDAZOLAM HCL 5 MG/5ML IJ SOLN
INTRAMUSCULAR | Status: DC | PRN
Start: 2017-10-15 — End: 2017-10-15
  Administered 2017-10-15: 2 mg via INTRAVENOUS

## 2017-10-15 MED ORDER — ONDANSETRON HCL 4 MG PO TABS: 4.0000 mg | ORAL_TABLET | Freq: Four times a day (QID) | ORAL | Status: DC | PRN

## 2017-10-15 MED ORDER — METHOCARBAMOL 500 MG PO TABS
500.0000 mg | ORAL_TABLET | Freq: Four times a day (QID) | ORAL | Status: DC | PRN
  Administered 2017-10-15 – 2017-10-16 (×2): 500 mg via ORAL
  Filled 2017-10-15 (×3): qty 1

## 2017-10-15 MED ORDER — ACETAMINOPHEN 10 MG/ML IV SOLN
INTRAVENOUS | Status: DC | PRN
  Administered 2017-10-15: 1000 mg via INTRAVENOUS

## 2017-10-15 MED ORDER — SENNA 8.6 MG PO TABS
1.0000 | ORAL_TABLET | Freq: Two times a day (BID) | ORAL | Status: DC
  Administered 2017-10-15 – 2017-10-16 (×2): 8.6 mg via ORAL
  Filled 2017-10-15 (×2): qty 1

## 2017-10-15 MED ORDER — ACETAMINOPHEN 325 MG PO TABS: 325.0000 mg | ORAL_TABLET | ORAL | Status: DC | PRN

## 2017-10-15 MED ORDER — MORPHINE SULFATE (PF) 4 MG/ML IV SOLN: 2.0000 mg | INTRAVENOUS | Status: DC | PRN

## 2017-10-15 MED ORDER — BISACODYL 10 MG RE SUPP: 10.0000 mg | Freq: Every day | RECTAL | Status: DC | PRN

## 2017-10-15 MED ORDER — BUPIVACAINE HCL (PF) 0.5 % IJ SOLN
INTRAMUSCULAR | Status: DC | PRN
  Administered 2017-10-15: 20 mL
  Administered 2017-10-15: 5 mL

## 2017-10-15 MED ORDER — PHENYLEPHRINE HCL 10 MG/ML IJ SOLN
INTRAMUSCULAR | Status: DC | PRN
  Administered 2017-10-15: 80 ug via INTRAVENOUS
  Administered 2017-10-15: 120 ug via INTRAVENOUS
  Administered 2017-10-15: 80 ug via INTRAVENOUS

## 2017-10-15 SURGICAL SUPPLY — 51 items
ADH SKN CLS APL DERMABOND .7 (GAUZE/BANDAGES/DRESSINGS) ×1
BAG DECANTER FOR FLEXI CONT (MISCELLANEOUS) ×2 IMPLANT
BLADE CLIPPER SURG (BLADE) IMPLANT
BUR ACORN 6.0 (BURR) IMPLANT
BUR MATCHSTICK NEURO 3.0 LAGG (BURR) ×2 IMPLANT
CANISTER SUCT 3000ML PPV (MISCELLANEOUS) ×2 IMPLANT
CARTRIDGE OIL MAESTRO DRILL (MISCELLANEOUS) ×1 IMPLANT
DECANTER SPIKE VIAL GLASS SM (MISCELLANEOUS) ×2 IMPLANT
DERMABOND ADVANCED (GAUZE/BANDAGES/DRESSINGS) ×1
DERMABOND ADVANCED .7 DNX12 (GAUZE/BANDAGES/DRESSINGS) ×1 IMPLANT
DEVICE DISSECT PLASMABLAD 3.0S (MISCELLANEOUS) ×1 IMPLANT
DIFFUSER DRILL AIR PNEUMATIC (MISCELLANEOUS) ×1 IMPLANT
DRAPE HALF SHEET 40X57 (DRAPES) ×1 IMPLANT
DRAPE LAPAROTOMY 100X72X124 (DRAPES) ×2 IMPLANT
DRAPE MICROSCOPE LEICA (MISCELLANEOUS) IMPLANT
DRAPE POUCH INSTRU U-SHP 10X18 (DRAPES) ×2 IMPLANT
DRSG OPSITE POSTOP 4X6 (GAUZE/BANDAGES/DRESSINGS) ×1 IMPLANT
DURAPREP 26ML APPLICATOR (WOUND CARE) ×2 IMPLANT
ELECT REM PT RETURN 9FT ADLT (ELECTROSURGICAL) ×2
ELECTRODE REM PT RTRN 9FT ADLT (ELECTROSURGICAL) ×1 IMPLANT
GAUZE SPONGE 4X4 12PLY STRL (GAUZE/BANDAGES/DRESSINGS) ×2 IMPLANT
GAUZE SPONGE 4X4 16PLY XRAY LF (GAUZE/BANDAGES/DRESSINGS) ×1 IMPLANT
GLOVE BIOGEL PI IND STRL 8.5 (GLOVE) ×1 IMPLANT
GLOVE BIOGEL PI INDICATOR 8.5 (GLOVE) ×1
GLOVE ECLIPSE 8.5 STRL (GLOVE) ×2 IMPLANT
GOWN STRL REUS W/ TWL LRG LVL3 (GOWN DISPOSABLE) IMPLANT
GOWN STRL REUS W/ TWL XL LVL3 (GOWN DISPOSABLE) IMPLANT
GOWN STRL REUS W/TWL 2XL LVL3 (GOWN DISPOSABLE) ×2 IMPLANT
GOWN STRL REUS W/TWL LRG LVL3 (GOWN DISPOSABLE)
GOWN STRL REUS W/TWL XL LVL3 (GOWN DISPOSABLE)
HEMOSTAT POWDER KIT SURGIFOAM (HEMOSTASIS) ×1 IMPLANT
KIT BASIN OR (CUSTOM PROCEDURE TRAY) ×2 IMPLANT
KIT ROOM TURNOVER OR (KITS) ×2 IMPLANT
NDL SPNL 20GX3.5 QUINCKE YW (NEEDLE) IMPLANT
NEEDLE HYPO 22GX1.5 SAFETY (NEEDLE) ×2 IMPLANT
NEEDLE SPNL 20GX3.5 QUINCKE YW (NEEDLE) ×2 IMPLANT
NS IRRIG 1000ML POUR BTL (IV SOLUTION) ×2 IMPLANT
OIL CARTRIDGE MAESTRO DRILL (MISCELLANEOUS)
PACK LAMINECTOMY NEURO (CUSTOM PROCEDURE TRAY) ×2 IMPLANT
PAD ARMBOARD 7.5X6 YLW CONV (MISCELLANEOUS) ×6 IMPLANT
PATTIES SURGICAL .5 X1 (DISPOSABLE) ×2 IMPLANT
PLASMABLADE 3.0S (MISCELLANEOUS) ×2
RUBBERBAND STERILE (MISCELLANEOUS) IMPLANT
SPONGE SURGIFOAM ABS GEL SZ50 (HEMOSTASIS) ×1 IMPLANT
SUT VIC AB 1 CT1 18XBRD ANBCTR (SUTURE) ×1 IMPLANT
SUT VIC AB 1 CT1 8-18 (SUTURE) ×2
SUT VIC AB 2-0 CP2 18 (SUTURE) ×2 IMPLANT
SUT VIC AB 3-0 SH 8-18 (SUTURE) ×3 IMPLANT
TOWEL GREEN STERILE (TOWEL DISPOSABLE) ×2 IMPLANT
TOWEL GREEN STERILE FF (TOWEL DISPOSABLE) ×2 IMPLANT
WATER STERILE IRR 1000ML POUR (IV SOLUTION) ×2 IMPLANT

## 2017-10-15 NOTE — Progress Notes (Signed)
Orthopedic Tech Progress Note Patient Details:  Douglas DoomJoseph E Ozier Jr. 03/10/1956 [id number] Called [employer] for brace. Patient ID: Douglas DoomJoseph E Culton Jr., male   DOB: 12/12/1956, 61 y.o.   MRN: [id number]   Douglas Roberts, Douglas Roberts 10/15/2017, 3:05 PM

## 2017-10-15 NOTE — Op Note (Signed)
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 Surgeon: Barnett AbuHenry Nicolette Gieske M.D. Assistant:Benjamin ditty M.D. Anesthesia: Gen. endotracheal Indications:patient is a 61 year old individual who has had significant back and bilateral lower extremity pain and weakness. Has evidence of severe spondylitic stenosis at L2-3 L3-4 and L4-5. Been advised regarding the need for surgical decompression of these levels and this now taken to the operating room for that procedure.  Procedure: Patient was brought to the operating room supine on a stretcher. After the smooth induction of general endotracheal anesthesia he was turned prone onto the operating table. The back was prepped with alcohol and DuraPrep and draped in a sterile fashion. Localizing radiographs identified the interspace at L3-L4. A midline incision was created and carried down to the lumbar dorsal fascia which was opened on either side of midline at this level. The dissection was carried out over the interlaminar space and the facet joints at L3-4. A self-retaining retractor was placed in the wound. A high-speed drill was then used to remove the inferior margin of the lamina out to the medial wall the facet performing the initial portion of the dissection. The yellow ligament was then taken up and removed. Common dural tube was identified and dissection was carefully undertaken removing redundant yellow ligament and overgrown facet from the superior facet ofL3and the laminar arch ofL4. A foraminotomy was created over theL4nerve root.this procedure was carried out bilaterally at this level. Then attention was turned to L4-L5 are similar laminotomies and foraminotomies were carried out. The L4 nerve root was decompressed  superiorly and the L5 nerve root inferiorly in a bilateral fashion. A similar process was then carried out at L2-L3.  Once an adequate decompression was identified and secured, hemostasis and the soft tissues obtained meticulously and when verified retractor was removed the wound was irrigated copiously with antibiotic irrigating solution, and then the lumbar dorsal fascia was closed with #1 Vicryl in interrupted fashion.20 mLOf half percent Marcaine was injected into the paraspinous fascia. 2-0 Vicryl was used to close the subcutaneous fascia and 3-0 Vicryl was used to close the subcuticular skin. Blood loss was estimated as125 mL. The patient was returned to the recovery room in stable condition

## 2017-10-15 NOTE — Progress Notes (Signed)
Vital signs are stable Motor function is intact Patient is reasonably comfortable with his back Foley catheter is out We'll maintain overnight

## 2017-10-15 NOTE — H&P (Signed)
CHIEF COMPLAINT:                                          Numbness and weakness in his lower extremities.  HISTORY OF PRESENT ILLNESS:                    Mr. Schult is a 61 year old right-handed individual who tells me that he has been having some difficulties with his back and his legs, although no real back pain. He notes that he gets numbness in his legs and feet when he stands for a period of time, and when he walks for a period of time, he notes that his legs feel like they are getting weak on him. He has to sit down and rest for a couple of minutes and then he can go on fairly well. This process has been going on since about July of this year, but he does not know that there is any acute event that seemed to bring it on. He notes that he does not have significant back pain nor discomfort. He was ultimately seen for this process and in October and underwent an MRI of the lumbar spine. That study demonstrates that he has some diffuse congenital-type spinal stenosis with the worst stenosis being at L2-3 and at L3-4 with significant central and lateral recess stenosis at these levels. He feels that his symptoms have been progressing. He did have some courses of oral steroid, which he notes did give him a reprieve from the symptoms for a period of time. Nonetheless, the symptoms have been recurring and continuing.   REVIEW OF SYSTEMS:                                    His systems review reveals that he has some ringing in the ears, leg pain while walking, high cholesterol, back pain, and leg pain all noted on a 14-point review sheet.  PAST MEDICAL HISTORY:                                His past medical history reveals that his general health has been good.           Current Medical Conditions:  He reports that he has some high blood pressure, which is controlled with medications.           Prior Operations:  His only surgery had been an adenoidectomy in 1971.           Medications and Allergies:   His current medications include Atenolol, Pravastatin, Doxazosin, Fiber Caps, Colace, Fish Oil, saw palmetto, and an Aspirin a day.  PHYSICAL EXAMINATION:                                On physical exam, I note that he stands straight and erect without difficulty. On observing his gait, I note that he has evidence of some distal lower extremity weakness in his tibialis anterior walking with what appears to be some mild bilateral foot drop, perhaps a bit worse on the right side. On individual testing, I note that he has a weakness, proximally, in the iliopsoas on the left and the quad.  He has weakness on the right in the tibialis anterior graded at 4/5. The left side is graded at 4+/5. His gastroc strength appears intact. Deep tendon reflexes, however, are absent in both the patellae and the Achilles. His upper extremity strength remains normal.  Impression and plan: Mr. Judithann GravesFarrar returns to the office today. On February 2, we did a translaminar epidural steroid injection at the level of L3-L4. Mr. Judithann GravesFarrar tells me that he had quite good relief for a period of about a month, but then the release seemed to dissipate. He notes that he was able to function, but not as good as he could before he had any lumbar stenosis, but certainly considerably better. I reviewed his MRIs and note that he has stenosis at L2-3, 3-4, and 4-5. The concerns are what to do next, and I noted to him that given the brief relief that he had with the epidural steroid injection, I would not be inclined to suggest repeated injections. Certainly another injection is not likely to give greater or longer relief. In that regard, I believe that ultimately Mr. Judithann GravesFarrar will need to consider surgical intervention. We previously discussed consideration of decompression and this would be done via laminotomies, that is removal of the ligament and overgrown bone on the inside of the joints at each level, L2-3, L3-4 and L4-5. I also discussed with him  consideration of using a Coflex device. This is an interspinous device that I demonstrated on a model, holds the spinous processes open. This would help to lessen the chance of arthritis developing further in the facet joints and hopefully keep his spine straighter and the decompression lasting longer. Either of the surgeries is done typically with an overnight stay. After the surgery with the Coflex device, most individuals are encouraged to use of brace for a period of about a month, but even with the simple laminotomies, use of an external corset helps to heal the back and make it a bit more comfortable. Most people are down after the surgery for period of 6-8 weeks. Mr. Judithann GravesFarrar notes that his job does require a fair amount of sitting, and with that I would suggest that he needs that time to recuperate before he can comfortably sit for that length of time. We discussed consideration of long-term steroid management, and I noted that long-term use of even low-dose Prednisone leads to significant complications, and I do not believe that it would be an effective way to treat him for the long run. I do believe, though, that surgical decompression can yield him substantial relief of the worst of his symptoms and hopefully give him good longevity with his back. I will remain available to plan as Mr. Judithann GravesFarrar sees fit.

## 2017-10-15 NOTE — Anesthesia Procedure Notes (Signed)
Procedure Name: Intubation Date/Time: 10/15/2017 11:32 AM Performed by: Salli Quarry Preslynn Bier Pre-anesthesia Checklist: Patient identified, Emergency Drugs available, Suction available and Patient being monitored Patient Re-evaluated:Patient Re-evaluated prior to induction Oxygen Delivery Method: Circle System Utilized Preoxygenation: Pre-oxygenation with 100% oxygen Induction Type: IV induction Ventilation: Mask ventilation without difficulty Laryngoscope Size: Mac and 4 Grade View: Grade I Tube type: Oral Tube size: 7.5 mm Number of attempts: 2 Airway Equipment and Method: Stylet and Oral airway Placement Confirmation: ETT inserted through vocal cords under direct vision,  positive ETCO2 and breath sounds checked- equal and bilateral Secured at: 24 cm Tube secured with: Tape Dental Injury: Teeth and Oropharynx as per pre-operative assessment  Comments: DLx1 with MAC 4 by paramedic, unable to obtain view; DLx2 with MAC 4 by CRNA, grade I view, atraumatic intubation with 7.5 oral ETT.

## 2017-10-15 NOTE — Transfer of Care (Signed)
Immediate Anesthesia Transfer of Care Note  Patient: Douglas DoomJoseph E Babler Jr.  Procedure(s) Performed: Bilateral Lumbar Two- Three Lumbar Three- Four Lumbar Four- Five Laminectomy/Foraminotomy (Bilateral Back)  Patient Location: PACU  Anesthesia Type:General  Level of Consciousness: awake, alert  and patient cooperative  Airway & Oxygen Therapy: Patient Spontanous Breathing and Patient connected to nasal cannula oxygen  Post-op Assessment: Report given to RN and Post -op Vital signs reviewed and stable  Post vital signs: Reviewed and stable  Last Vitals:  Vitals:   10/15/17 1010 10/15/17 1408  BP: (!) 148/85 (!) 155/83  Pulse: 70 90  Resp: 20 (!) 8  Temp: 36.9 C   SpO2: 98% 98%    Last Pain: There were no vitals filed for this visit.    Patients Stated Pain Goal: 4 (10/15/17 1019)  Complications: No apparent anesthesia complications

## 2017-10-15 NOTE — Evaluation (Signed)
Physical Therapy Evaluation Patient Details Name: Douglas DoomJoseph E Okonski Jr. MRN: [id number] DOB: 10/03/1956 Today's Date: 10/15/2017   History of Present Illness  Pt is a 61 y.o male s/p L2-5 Laminectomy and microdiskectomy. PMH includes HTN.   Clinical Impression  Pt is s/p surgery above with deficits below. PTA, pt was independent with mobility. Upon eval, pt presenting with post op pain and slight unsteadiness. Required min to min guard assist for mobility this session. Reports wife will be able to assist as needed upon d/c. Will continue to follow acutely to maximize functional mobility independence and safety.     Follow Up Recommendations No PT follow up;Supervision - Intermittent    Equipment Recommendations  None recommended by PT    Recommendations for Other Services       Precautions / Restrictions Precautions Precautions: Back Precaution Booklet Issued: Yes (comment) Precaution Comments: Reviewed back precautions with pt.  Required Braces or Orthoses: Spinal Brace Spinal Brace: Lumbar corset;Applied in sitting position Restrictions Weight Bearing Restrictions: No      Mobility  Bed Mobility Overal bed mobility: Needs Assistance Bed Mobility: Rolling;Sidelying to Sit Rolling: Supervision Sidelying to sit: Min guard       General bed mobility comments: Supervision to min guard to ensure log roll technique.   Transfers Overall transfer level: Needs assistance Equipment used: None Transfers: Sit to/from Stand Sit to Stand: Min assist         General transfer comment: Min A for steadying assist upon standing, as pt was slightly unsteady. Verbal cues to power through BLE.   Ambulation/Gait Ambulation/Gait assistance: Min guard Ambulation Distance (Feet): 400 Feet Assistive device: None Gait Pattern/deviations: Step-through pattern;Decreased stride length;Drifts right/left Gait velocity: Decreased  Gait velocity interpretation: Below normal speed for  age/gender General Gait Details: Slow, guarded, slightly unsteady gait initially. Increased steadiness with increased distance. No overt LOB noted.   Stairs            Wheelchair Mobility    Modified Rankin (Stroke Patients Only)       Balance Overall balance assessment: Needs assistance Sitting-balance support: No upper extremity supported;Feet supported Sitting balance-Leahy Scale: Good     Standing balance support: No upper extremity supported;During functional activity Standing balance-Leahy Scale: Fair                               Pertinent Vitals/Pain Pain Assessment: 0-10 Pain Score: 1  Pain Location: back  Pain Descriptors / Indicators: Operative site guarding Pain Intervention(s): Limited activity within patient's tolerance;Monitored during session;Repositioned    Home Living Family/patient expects to be discharged to:: Private residence Living Arrangements: Spouse/significant other Available Help at Discharge: Family;Available 24 hours/day Type of Home: House Home Access: Stairs to enter Entrance Stairs-Rails: None Entrance Stairs-Number of Steps: 3 Home Layout: Two level Home Equipment: Shower seat      Prior Function Level of Independence: Independent               Hand Dominance        Extremity/Trunk Assessment   Upper Extremity Assessment Upper Extremity Assessment: Overall WFL for tasks assessed    Lower Extremity Assessment Lower Extremity Assessment: Overall WFL for tasks assessed    Cervical / Trunk Assessment Cervical / Trunk Assessment: Other exceptions Cervical / Trunk Exceptions: s/p laminectomy and microdiskectomy  Communication   Communication: No difficulties  Cognition Arousal/Alertness: Awake/alert Behavior During Therapy: WFL for tasks assessed/performed Overall Cognitive Status: Within Functional  Limits for tasks assessed                                        General Comments  General comments (skin integrity, edema, etc.): Educated about generalized walking program to perform at home.     Exercises     Assessment/Plan    PT Assessment Patient needs continued PT services  PT Problem List Decreased balance;Decreased mobility;Decreased knowledge of precautions;Pain       PT Treatment Interventions Gait training;Stair training;Functional mobility training;Therapeutic activities;Balance training;Therapeutic exercise;Neuromuscular re-education;Patient/family education    PT Goals (Current goals can be found in the Care Plan section)  Acute Rehab PT Goals Patient Stated Goal: to go home  PT Goal Formulation: With patient Time For Goal Achievement: 10/22/17 Potential to Achieve Goals: Good    Frequency Min 5X/week   Barriers to discharge        Co-evaluation               AM-PAC PT "6 Clicks" Daily Activity  Outcome Measure Difficulty turning over in bed (including adjusting bedclothes, sheets and blankets)?: None Difficulty moving from lying on back to sitting on the side of the bed? : A Little Difficulty sitting down on and standing up from a chair with arms (e.g., wheelchair, bedside commode, etc,.)?: Unable Help needed moving to and from a bed to chair (including a wheelchair)?: A Little Help needed walking in hospital room?: A Little Help needed climbing 3-5 steps with a railing? : A Little 6 Click Score: 17    End of Session Equipment Utilized During Treatment: Gait belt;Back brace Activity Tolerance: Patient tolerated treatment well Patient left: in chair;with call bell/phone within reach;with family/visitor present Nurse Communication: Mobility status PT Visit Diagnosis: Other abnormalities of gait and mobility (R26.89);Pain Pain - part of body:  (back )    Time: 1610-9604 PT Time Calculation (min) (ACUTE ONLY): 16 min   Charges:   PT Evaluation $PT Eval Low Complexity: 1 Low     PT G Codes:   PT G-Codes **NOT FOR INPATIENT  CLASS** Functional Assessment Tool Used: AM-PAC 6 Clicks Basic Mobility;Clinical judgement Functional Limitation: Mobility: Walking and moving around Mobility: Walking and Moving Around Current Status (V4098): At least 40 percent but less than 60 percent impaired, limited or restricted Mobility: Walking and Moving Around Goal Status [phone]): At least 1 percent but less than 20 percent impaired, limited or restricted    Gladys Damme, PT, DPT  Acute Rehabilitation Services  Pager: [phone]   Lehman Prom 10/15/2017, 5:48 PM

## 2017-10-16 ENCOUNTER — Encounter (HOSPITAL_COMMUNITY): Payer: Self-pay | Admitting: Neurological Surgery

## 2017-10-16 DIAGNOSIS — M48062 Spinal stenosis, lumbar region with neurogenic claudication: Secondary | ICD-10-CM | POA: Diagnosis not present

## 2017-10-16 MED ORDER — METHOCARBAMOL 500 MG PO TABS: 500.0000 mg | ORAL_TABLET | Freq: Four times a day (QID) | ORAL | 3 refills | Status: DC | PRN

## 2017-10-16 MED ORDER — DEXAMETHASONE 4 MG PO TABS
4.0000 mg | ORAL_TABLET | Freq: Once | ORAL | Status: AC
  Administered 2017-10-16: 4 mg via ORAL
  Filled 2017-10-16: qty 1

## 2017-10-16 MED ORDER — HYDROCODONE-ACETAMINOPHEN 5-325 MG PO TABS: 1.0000 | ORAL_TABLET | ORAL | 0 refills | Status: DC | PRN

## 2017-10-16 NOTE — Progress Notes (Signed)
Physical Therapy Treatment Patient Details Name: Douglas Roberts. MRN: [id number] DOB: 08/29/1956 Today's Date: 10/16/2017    History of Present Illness Pt is a 61 y.o male s/p L2-5 Laminectomy and microdiskectomy. PMH includes HTN.     PT Comments    Pt progressing towards physical therapy goals. Was able to perform transfers and ambulation with gross mod I and no AD. Pt was cued for precautions, car transfer, walking program and general safety with activity progression. Will continue to follow and progress as able per POC.   Follow Up Recommendations  No PT follow up;Supervision - Intermittent     Equipment Recommendations  None recommended by PT    Recommendations for Other Services       Precautions / Restrictions Precautions Precautions: Back Precaution Booklet Issued: Yes (comment) Precaution Comments: Pt verbally recalls back precautions Required Braces or Orthoses: Spinal Brace Spinal Brace: Lumbar corset;Applied in sitting position Restrictions Weight Bearing Restrictions: No    Mobility  Bed Mobility               General bed mobility comments: Pt OOB upon arrival; verbalizes log roll technique   Transfers Overall transfer level: Modified independent Equipment used: None Transfers: Sit to/from Stand Sit to Stand: Mod I         General transfer comment: No assist required and pt demonstrated proper technique. No unsteadiness noted.   Ambulation/Gait Ambulation/Gait assistance: Modified independent (Device/Increase time) Ambulation Distance (Feet): 400 Feet Assistive device: None Gait Pattern/deviations: Step-through pattern;Decreased stride length;Drifts right/left Gait velocity: Decreased  Gait velocity interpretation: Below normal speed for age/gender General Gait Details: Slow, guarded, slightly unsteady gait initially. Increased steadiness with increased distance. No overt LOB noted.    Stairs            Wheelchair Mobility     Modified Rankin (Stroke Patients Only)       Balance Overall balance assessment: No apparent balance deficits (not formally assessed) Sitting-balance support: No upper extremity supported;Feet supported Sitting balance-Leahy Scale: Good     Standing balance support: No upper extremity supported;During functional activity Standing balance-Leahy Scale: Fair                              Cognition Arousal/Alertness: Awake/alert Behavior During Therapy: WFL for tasks assessed/performed Overall Cognitive Status: Within Functional Limits for tasks assessed                                        Exercises      General Comments General comments (skin integrity, edema, etc.): Pt's spouse present during session       Pertinent Vitals/Pain Pain Assessment: Faces Pain Score: 3  Faces Pain Scale: Hurts a little bit Pain Location: back  Pain Descriptors / Indicators: Operative site guarding Pain Intervention(s): Limited activity within patient's tolerance;Monitored during session;Repositioned    Home Living Family/patient expects to be discharged to:: Private residence Living Arrangements: Spouse/significant other Available Help at Discharge: Family;Available 24 hours/day Type of Home: House Home Access: Stairs to enter   Home Layout: Two level Home Equipment: Toilet riser      Prior Function Level of Independence: Independent          PT Goals (current goals can now be found in the care plan section) Acute Rehab PT Goals Patient Stated Goal: to go home  PT  Goal Formulation: With patient Time For Goal Achievement: 10/22/17 Potential to Achieve Goals: Good Progress towards PT goals: Progressing toward goals    Frequency    Min 5X/week      PT Plan Current plan remains appropriate    Co-evaluation              AM-PAC PT "6 Clicks" Daily Activity  Outcome Measure  Difficulty turning over in bed (including adjusting  bedclothes, sheets and blankets)?: None Difficulty moving from lying on back to sitting on the side of the bed? : None Difficulty sitting down on and standing up from a chair with arms (e.g., wheelchair, bedside commode, etc,.)?: None Help needed moving to and from a bed to chair (including a wheelchair)?: None Help needed walking in hospital room?: None Help needed climbing 3-5 steps with a railing? : None 6 Click Score: 24    End of Session Equipment Utilized During Treatment: Gait belt;Back brace Activity Tolerance: Patient tolerated treatment well Patient left: in chair;with call bell/phone within reach;with family/visitor present Nurse Communication: Mobility status PT Visit Diagnosis: Other abnormalities of gait and mobility (R26.89);Pain Pain - part of body:  (back )     Time: [id number] PT Time Calculation (min) (ACUTE ONLY): 12 min  Charges:  $Gait Training: 8-22 mins                    G Codes:       Conni SlipperLaura Kaitlynn Tramontana, PT, DPT Acute Rehabilitation Services Pager: [phone]    Marylynn PearsonLaura D Tiffany Calmes 10/16/2017, 10:58 AM

## 2017-10-16 NOTE — Progress Notes (Signed)
Patient alert and oriented, mae's well, voiding adequate amount of urine, swallowing without difficulty, no c/o pain at time of discharge. Patient discharged home with family. Script and discharged instructions given to patient. Patient and family stated understanding of instructions given. Patient has an appointment with Dr. Elsner  

## 2017-10-16 NOTE — Evaluation (Signed)
Occupational Therapy Evaluation Patient Details Name: Douglas DoomJoseph E Paulding Jr. MRN: [id number] DOB: 10/23/1956 Today's Date: 10/16/2017    History of Present Illness Pt is a 61 y.o male s/p L2-5 Laminectomy and microdiskectomy. PMH includes HTN.    Clinical Impression   This [age] y [place]/o M presents with the above. At baseline Pt is independent with ADLs and functional mobility. Pt completed room level functional mobility, seated and standing UB/LB ADLs with MinGuard throughout and min verbal cues for adhering to back precautions. Pt will return home with spouse who is able to assist PRN. Education provided and questions answered throughout regarding safety and compensatory techniques for completing ADLs while adhering to back precautions. Feel Pt will safely return home with spouse assist PRN. No further acute OT needs identified at this time. Will sign off.     Follow Up Recommendations  No OT follow up;Supervision/Assistance - 24 hour (initially )    Equipment Recommendations  None recommended by OT           Precautions / Restrictions Precautions Precautions: Back Precaution Comments: Pt verbally recalls back precautions Required Braces or Orthoses: Spinal Brace Spinal Brace: Lumbar corset;Applied in sitting position Restrictions Weight Bearing Restrictions: No      Mobility Bed Mobility               General bed mobility comments: Pt OOB upon arrival; verbalizes log roll technique   Transfers Overall transfer level: Needs assistance Equipment used: None Transfers: Sit to/from Stand Sit to Stand: Supervision         General transfer comment: supervision for safety     Balance Overall balance assessment: No apparent balance deficits (not formally assessed)                                         ADL either performed or assessed with clinical judgement   ADL Overall ADL's : Needs assistance/impaired Eating/Feeding: Independent;Sitting   Grooming:  Supervision/safety;Standing   Upper Body Bathing: Supervision/ safety;Sitting   Lower Body Bathing: Min guard;Sit to/from stand   Upper Body Dressing : Min guard;Standing   Lower Body Dressing: Min guard;Sit to/from stand Lower Body Dressing Details (indicate cue type and reason): Pt able to bring LEs towards upper body to don underwear, pants, and to doff socks with MinGuard for safety; able to complete without breaking back precautions  Toilet Transfer: Min guard;Ambulation;Comfort height toilet   Toileting- Clothing Manipulation and Hygiene: Min guard;Sit to/from stand Toileting - Clothing Manipulation Details (indicate cue type and reason): educated on safety during peri-hygiene while adhering to back precautions and availability of AE if needed    Tub/Shower Transfer Details (indicate cue type and reason): Educated on use of shower seat in shower for increased safety as well as increased ease of reaching lower body while adhering to back precautions with Pt verbalizing understanding  Functional mobility during ADLs: Min guard General ADL Comments: Educated on safety, AE/DME, and compensatory techniques for completing ADLs while adhering to back precautions with Pt/Pt's spouse verbalizing and demonstrating good understanding                          Pertinent Vitals/Pain Pain Assessment: 0-10 Pain Score: 3  Pain Location: back  Pain Descriptors / Indicators: Operative site guarding Pain Intervention(s): Monitored during session          Extremity/Trunk Assessment  Upper Extremity Assessment Upper Extremity Assessment: Overall WFL for tasks assessed   Lower Extremity Assessment Lower Extremity Assessment: Defer to PT evaluation   Cervical / Trunk Assessment Cervical / Trunk Assessment: Other exceptions Cervical / Trunk Exceptions: s/p laminectomy and microdiskectomy   Communication Communication Communication: No difficulties   Cognition Arousal/Alertness:  Awake/alert Behavior During Therapy: WFL for tasks assessed/performed Overall Cognitive Status: Within Functional Limits for tasks assessed                                     General Comments  Pt's spouse present during session                Home Living Family/patient expects to be discharged to:: Private residence Living Arrangements: Spouse/significant other Available Help at Discharge: Family;Available 24 hours/day Type of Home: House Home Access: Stairs to enter [employer] of Steps: 3   Home Layout: Two level Alternate Level Stairs-Number of Steps: 13 Alternate Level Stairs-Rails: Left Bathroom Shower/Tub: Producer, television/film/video: Handicapped height     Home Equipment: Toilet riser          Prior Functioning/Environment Level of Independence: Independent                 OT Problem List: Decreased range of motion;Decreased knowledge of precautions;Decreased knowledge of use of DME or AE            OT Goals(Current goals can be found in the care plan section) Acute Rehab OT Goals Patient Stated Goal: to go home  OT Goal Formulation: All assessment and education complete, DC therapy                                 AM-PAC PT "6 Clicks" Daily Activity     Outcome Measure Help from another person eating meals?: None Help from another person taking care of personal grooming?: A Little Help from another person toileting, which includes using toliet, bedpan, or urinal?: A Little Help from another person bathing (including washing, rinsing, drying)?: A Little Help from another person to put on and taking off regular upper body clothing?: A Little Help from another person to put on and taking off regular lower body clothing?: A Little 6 Click Score: 19   End of Session Equipment Utilized During Treatment: Back brace Nurse Communication: Mobility status  Activity Tolerance: Patient tolerated treatment  well Patient left: in chair;with call bell/phone within reach;with family/visitor present  OT Visit Diagnosis: Other abnormalities of gait and mobility (R26.89)                Time: 1610-9604 OT Time Calculation (min): 23 min Charges:  OT General Charges $OT Visit: 1 Visit OT Evaluation $OT Eval Low Complexity: 1 Low G-Codes: OT G-codes **NOT FOR INPATIENT CLASS** Functional Assessment Tool Used: AM-PAC 6 Clicks Daily Activity;Clinical judgement Functional Limitation: Self care Self Care Current Status (V4098): At least 1 percent but less than 20 percent impaired, limited or restricted Self Care Goal Status (J1914): At least 1 percent but less than 20 percent impaired, limited or restricted Self Care Discharge Status [phone]): At least 1 percent but less than 20 percent impaired, limited or restricted   Marcy Siren, OT Pager [phone] 10/16/2017   Douglas Roberts 10/16/2017, 10:34 AM

## 2017-10-16 NOTE — Discharge Summary (Signed)
Date of admission: 10/15/2017 Date of discharge: 10/16/2017 Admitting diagnosis: Lumbar spondylosis and stenosis L to 3 L3-4 L4-5 with radiculopathy, neurogenic claudication. Discharge and final diagnosis: Lumbar spondylosis and stenosis L2-3 L3-4 L4-5 with radiculopathy, neurogenic claudication. Condition on discharge: Improved Hospital course: Patient was admitted to undergo surgical decompression at L2-3 34 and 45 which he tolerated well. His been ambulatory. His incision is clean and dry. Is discharged home with some medication in the form of hydrocodone 04/18/2024 #50 without refills, methocarbamol 500 mg #40 without refills. He'll be seen in the office in 2 weeks time.

## 2017-10-17 ENCOUNTER — Encounter (HOSPITAL_COMMUNITY): Payer: Self-pay | Admitting: Neurological Surgery

## 2017-10-17 NOTE — Anesthesia Postprocedure Evaluation (Signed)
Anesthesia Post Note  Patient: Douglas DoomJoseph E Scarberry Jr.  Procedure(s) Performed: Bilateral Lumbar Two- Three Lumbar Three- Four Lumbar Four- Five Laminectomy/Foraminotomy (Bilateral Back)     Patient location during evaluation: PACU Anesthesia Type: General Level of consciousness: awake and alert Pain management: pain level controlled Vital Signs Assessment: post-procedure vital signs reviewed and stable Respiratory status: spontaneous breathing, nonlabored ventilation, respiratory function stable and patient connected to nasal cannula oxygen Cardiovascular status: blood pressure returned to baseline and stable Postop Assessment: no apparent nausea or vomiting Anesthetic complications: no    Last Vitals:  Vitals:   10/16/17 0345 10/16/17 0851  BP: 113/79 109/79  Pulse: 81 78  Resp: 18 18  Temp: (!) 36.4 C 36.8 C  SpO2: 98% 98%    Last Pain:  Vitals:   10/16/17 1113  TempSrc:   PainSc: 3                  Kassie Keng

## 2017-10-17 NOTE — Anesthesia Preprocedure Evaluation (Signed)
Anesthesia Evaluation  Patient identified by MRN, date of birth, ID band Patient awake    Reviewed: Allergy & Precautions, NPO status , Patient's Chart, lab work & pertinent test results  History of Anesthesia Complications Negative for: history of anesthetic complications  Airway Mallampati: II  TM Distance: >3 FB Neck ROM: Full    Dental  (+) Teeth Intact   Pulmonary sleep apnea ,    breath sounds clear to auscultation       Cardiovascular hypertension, Pt. on medications (-) angina(-) Past MI and (-) CHF  Rhythm:Regular     Neuro/Psych negative neurological ROS  negative psych ROS   GI/Hepatic negative GI ROS, Neg liver ROS,   Endo/Other  negative endocrine ROS  Renal/GU negative Renal ROS     Musculoskeletal   Abdominal   Peds  Hematology negative hematology ROS (+)   Anesthesia Other Findings   Reproductive/Obstetrics                             Anesthesia Physical Anesthesia Plan  ASA: II  Anesthesia Plan: General   Post-op Pain Management:    Induction: Intravenous  PONV Risk Score and Plan: 2 and Ondansetron, Dexamethasone and Treatment may vary due to age or medical condition  Airway Management Planned: Oral ETT  Additional Equipment: None  Intra-op Plan:   Post-operative Plan: Extubation in OR  Informed Consent: I have reviewed the patients History and Physical, chart, labs and discussed the procedure including the risks, benefits and alternatives for the proposed anesthesia with the patient or authorized representative who has indicated his/her understanding and acceptance.   Dental advisory given  Plan Discussed with: CRNA and Surgeon  Anesthesia Plan Comments:         Anesthesia Quick Evaluation

## 2017-10-18 ENCOUNTER — Inpatient Hospital Stay (HOSPITAL_COMMUNITY)
Admission: EM | Admit: 2017-10-18 | Discharge: 2017-10-23 | DRG: 948 | Disposition: A | Discharge status: Home / Self Care | Payer: BC Managed Care – PPO | Attending: Neurological Surgery | Admitting: Neurological Surgery

## 2017-10-18 ENCOUNTER — Encounter (HOSPITAL_COMMUNITY): Payer: Self-pay | Admitting: Oncology

## 2017-10-18 DIAGNOSIS — R339 Retention of urine, unspecified: Secondary | ICD-10-CM | POA: Diagnosis present

## 2017-10-18 DIAGNOSIS — M79651 Pain in right thigh: Secondary | ICD-10-CM | POA: Diagnosis present

## 2017-10-18 DIAGNOSIS — G8918 Other acute postprocedural pain: Secondary | ICD-10-CM | POA: Diagnosis not present

## 2017-10-18 DIAGNOSIS — M79652 Pain in left thigh: Secondary | ICD-10-CM | POA: Diagnosis present

## 2017-10-18 DIAGNOSIS — K5900 Constipation, unspecified: Secondary | ICD-10-CM | POA: Diagnosis present

## 2017-10-18 DIAGNOSIS — E785 Hyperlipidemia, unspecified: Secondary | ICD-10-CM | POA: Diagnosis present

## 2017-10-18 DIAGNOSIS — I10 Essential (primary) hypertension: Secondary | ICD-10-CM | POA: Diagnosis present

## 2017-10-18 DIAGNOSIS — Z8249 Family history of ischemic heart disease and other diseases of the circulatory system: Secondary | ICD-10-CM

## 2017-10-18 DIAGNOSIS — M5459 Other low back pain: Secondary | ICD-10-CM | POA: Diagnosis present

## 2017-10-18 DIAGNOSIS — Z7982 Long term (current) use of aspirin: Secondary | ICD-10-CM

## 2017-10-18 DIAGNOSIS — M545 Low back pain: Secondary | ICD-10-CM | POA: Diagnosis present

## 2017-10-18 DIAGNOSIS — Z981 Arthrodesis status: Secondary | ICD-10-CM

## 2017-10-18 NOTE — ED Triage Notes (Signed)
Pt bib [facility] d/t severe back pain.  Pt had back surgery here on 10/30.  Pt reported to EMS that he walked out of the hospital w/o difficulty upon d/c.  However pt developed severe back pain today making it difficult for him to stand or walk.  Pt has been taking home pain medication w/o relief.

## 2017-10-19 ENCOUNTER — Encounter (HOSPITAL_COMMUNITY): Payer: Self-pay | Admitting: Physician Assistant

## 2017-10-19 ENCOUNTER — Emergency Department (HOSPITAL_COMMUNITY): Payer: BC Managed Care – PPO

## 2017-10-19 DIAGNOSIS — M545 Low back pain: Secondary | ICD-10-CM | POA: Diagnosis present

## 2017-10-19 DIAGNOSIS — M5459 Other low back pain: Secondary | ICD-10-CM | POA: Diagnosis present

## 2017-10-19 LAB — BASIC METABOLIC PANEL
ANION GAP: 12 (ref 5–15)
BUN: 11 mg/dL (ref 6–20)
CHLORIDE: 100 mmol/L — AB (ref 101–111)
CO2: 20 mmol/L — AB (ref 22–32)
Calcium: 9.5 mg/dL (ref 8.9–10.3)
Creatinine, Ser: 0.83 mg/dL (ref 0.61–1.24)
GFR calc Af Amer: >60 mL/min (ref >60)
GLUCOSE: 114 mg/dL — AB (ref 65–99)
POTASSIUM: 3.6 mmol/L (ref 3.5–5.1)
Sodium: 132 mmol/L — ABNORMAL LOW (ref 135–145)

## 2017-10-19 LAB — CBC WITH DIFFERENTIAL/PLATELET
BASOS ABS: 0 10*3/uL (ref 0.0–0.1)
Basophils Relative: 0 %
Eosinophils Absolute: 0.1 10*3/uL (ref 0.0–0.7)
Eosinophils Relative: 1 %
HEMATOCRIT: 38.4 % — AB (ref 39.0–52.0)
HEMOGLOBIN: 13.1 g/dL (ref 13.0–17.0)
LYMPHS ABS: 1.2 10*3/uL (ref 0.7–4.0)
LYMPHS PCT: 18 %
MCH: 32.8 pg (ref 26.0–34.0)
MCHC: 34.1 g/dL (ref 30.0–36.0)
MCV: 96 fL (ref 78.0–100.0)
Monocytes Absolute: 0.7 10*3/uL (ref 0.1–1.0)
Monocytes Relative: 11 %
NEUTROS ABS: 4.4 10*3/uL (ref 1.7–7.7)
NEUTROS PCT: 70 %
PLATELETS: 203 10*3/uL (ref 150–400)
RBC: 4 MIL/uL — AB (ref 4.22–5.81)
RDW: 13 % (ref 11.5–15.5)
WBC: 6.4 10*3/uL (ref 4.0–10.5)

## 2017-10-19 LAB — C-REACTIVE PROTEIN: CRP: 13.5 mg/dL — ABNORMAL HIGH (ref <1.0)

## 2017-10-19 LAB — SEDIMENTATION RATE: Sed Rate: 44 mm/hr — ABNORMAL HIGH (ref 0–16)

## 2017-10-19 LAB — HIV ANTIBODY (ROUTINE TESTING W REFLEX): HIV Screen 4th Generation wRfx: NONREACTIVE

## 2017-10-19 MED ORDER — PRAVASTATIN SODIUM 40 MG PO TABS
40.0000 mg | ORAL_TABLET | Freq: Every day | ORAL | Status: DC
  Administered 2017-10-19 – 2017-10-22 (×4): 40 mg via ORAL
  Filled 2017-10-19 (×4): qty 1

## 2017-10-19 MED ORDER — SAW PALMETTO (SERENOA REPENS) 80 MG PO CAPS: 80.0000 mg | ORAL_CAPSULE | Freq: Every day | ORAL | Status: DC

## 2017-10-19 MED ORDER — BISACODYL 10 MG RE SUPP: 10.0000 mg | Freq: Every day | RECTAL | Status: DC | PRN

## 2017-10-19 MED ORDER — ACETAMINOPHEN 325 MG PO TABS
650.0000 mg | ORAL_TABLET | Freq: Four times a day (QID) | ORAL | Status: DC | PRN
  Administered 2017-10-20: 650 mg via ORAL
  Filled 2017-10-19: qty 2

## 2017-10-19 MED ORDER — ONDANSETRON HCL 4 MG/2ML IJ SOLN: 4.0000 mg | Freq: Four times a day (QID) | INTRAMUSCULAR | Status: DC | PRN

## 2017-10-19 MED ORDER — DIAZEPAM 5 MG/ML IJ SOLN
5.0000 mg | INTRAMUSCULAR | Status: DC | PRN
  Administered 2017-10-19 (×2): 5 mg via INTRAVENOUS
  Filled 2017-10-19 (×2): qty 2

## 2017-10-19 MED ORDER — CALCIUM POLYCARBOPHIL 625 MG PO TABS
1250.0000 mg | ORAL_TABLET | Freq: Two times a day (BID) | ORAL | Status: DC
  Administered 2017-10-20 – 2017-10-23 (×6): 1250 mg via ORAL
  Filled 2017-10-19 (×10): qty 2

## 2017-10-19 MED ORDER — NALOXONE HCL 0.4 MG/ML IJ SOLN: 0.4000 mg | INTRAMUSCULAR | Status: DC | PRN

## 2017-10-19 MED ORDER — IBUPROFEN 200 MG PO TABS
400.0000 mg | ORAL_TABLET | Freq: Three times a day (TID) | ORAL | Status: DC | PRN
  Administered 2017-10-20 – 2017-10-22 (×3): 400 mg via ORAL
  Filled 2017-10-19 (×3): qty 2

## 2017-10-19 MED ORDER — LORAZEPAM 2 MG/ML IJ SOLN
0.5000 mg | Freq: Once | INTRAMUSCULAR | Status: AC
  Administered 2017-10-19: 0.5 mg via INTRAVENOUS
  Filled 2017-10-19: qty 1

## 2017-10-19 MED ORDER — DOCUSATE SODIUM 100 MG PO CAPS
100.0000 mg | ORAL_CAPSULE | Freq: Two times a day (BID) | ORAL | Status: DC
  Administered 2017-10-19 – 2017-10-23 (×7): 100 mg via ORAL
  Filled 2017-10-19 (×8): qty 1

## 2017-10-19 MED ORDER — OXYCODONE HCL 5 MG PO TABS: 5.0000 mg | ORAL_TABLET | ORAL | Status: DC | PRN

## 2017-10-19 MED ORDER — ZOLPIDEM TARTRATE 5 MG PO TABS
5.0000 mg | ORAL_TABLET | Freq: Every evening | ORAL | Status: DC | PRN
  Administered 2017-10-19 – 2017-10-22 (×3): 5 mg via ORAL
  Filled 2017-10-19 (×4): qty 1

## 2017-10-19 MED ORDER — ONDANSETRON HCL 4 MG PO TABS: 4.0000 mg | ORAL_TABLET | Freq: Four times a day (QID) | ORAL | Status: DC | PRN

## 2017-10-19 MED ORDER — ONDANSETRON HCL 4 MG/2ML IJ SOLN
4.0000 mg | Freq: Once | INTRAMUSCULAR | Status: AC
  Administered 2017-10-19: 4 mg via INTRAVENOUS
  Filled 2017-10-19: qty 2

## 2017-10-19 MED ORDER — HYDROMORPHONE HCL 1 MG/ML IJ SOLN
2.0000 mg | Freq: Once | INTRAMUSCULAR | Status: AC
  Administered 2017-10-19: 2 mg via INTRAVENOUS
  Filled 2017-10-19: qty 2

## 2017-10-19 MED ORDER — HYDROMORPHONE HCL 1 MG/ML IJ SOLN
1.0000 mg | INTRAMUSCULAR | Status: DC | PRN
  Administered 2017-10-19 – 2017-10-21 (×5): 1 mg via INTRAVENOUS
  Filled 2017-10-19 (×8): qty 1

## 2017-10-19 MED ORDER — METHOCARBAMOL 500 MG PO TABS
500.0000 mg | ORAL_TABLET | Freq: Four times a day (QID) | ORAL | Status: DC | PRN
  Administered 2017-10-19 – 2017-10-23 (×12): 500 mg via ORAL
  Filled 2017-10-19 (×12): qty 1

## 2017-10-19 MED ORDER — FLEET ENEMA 7-19 GM/118ML RE ENEM
1.0000 | ENEMA | Freq: Once | RECTAL | Status: AC
  Administered 2017-10-19: 1 via RECTAL

## 2017-10-19 MED ORDER — SODIUM CHLORIDE 0.9 % IV SOLN: 250.0000 mL | INTRAVENOUS | Status: DC | PRN

## 2017-10-19 MED ORDER — SODIUM CHLORIDE 0.9% FLUSH: 3.0000 mL | INTRAVENOUS | Status: DC | PRN

## 2017-10-19 MED ORDER — OMEGA-3-ACID ETHYL ESTERS 1 G PO CAPS
1.0000 g | ORAL_CAPSULE | Freq: Every day | ORAL | Status: DC
  Administered 2017-10-19 – 2017-10-22 (×4): 1 g via ORAL
  Filled 2017-10-19 (×4): qty 1

## 2017-10-19 MED ORDER — MAGNESIUM CITRATE PO SOLN
1.0000 | Freq: Once | ORAL | Status: AC | PRN
  Administered 2017-10-22: 1 via ORAL
  Filled 2017-10-19: qty 296

## 2017-10-19 MED ORDER — ACETAMINOPHEN 650 MG RE SUPP: 650.0000 mg | Freq: Four times a day (QID) | RECTAL | Status: DC | PRN

## 2017-10-19 MED ORDER — SODIUM CHLORIDE 0.9% FLUSH
3.0000 mL | Freq: Two times a day (BID) | INTRAVENOUS | Status: DC
  Administered 2017-10-19 – 2017-10-22 (×7): 3 mL via INTRAVENOUS

## 2017-10-19 MED ORDER — SENNOSIDES-DOCUSATE SODIUM 8.6-50 MG PO TABS
1.0000 | ORAL_TABLET | Freq: Every evening | ORAL | Status: DC | PRN
  Administered 2017-10-19: 1 via ORAL

## 2017-10-19 MED ORDER — CARISOPRODOL 350 MG PO TABS
350.0000 mg | ORAL_TABLET | Freq: Four times a day (QID) | ORAL | Status: DC
  Administered 2017-10-19 – 2017-10-21 (×10): 350 mg via ORAL
  Filled 2017-10-19 (×9): qty 1

## 2017-10-19 MED ORDER — ATENOLOL 25 MG PO TABS
25.0000 mg | ORAL_TABLET | Freq: Every day | ORAL | Status: DC
  Administered 2017-10-19 – 2017-10-22 (×4): 25 mg via ORAL
  Filled 2017-10-19 (×4): qty 1

## 2017-10-19 MED ORDER — ASPIRIN 81 MG PO CHEW
81.0000 mg | CHEWABLE_TABLET | Freq: Every day | ORAL | Status: DC
  Administered 2017-10-19 – 2017-10-22 (×4): 81 mg via ORAL
  Filled 2017-10-19 (×4): qty 1

## 2017-10-19 MED ORDER — HYDROMORPHONE HCL 1 MG/ML IJ SOLN
1.0000 mg | Freq: Once | INTRAMUSCULAR | Status: AC
  Administered 2017-10-19: 1 mg via INTRAVENOUS
  Filled 2017-10-19: qty 1

## 2017-10-19 MED ORDER — BISACODYL 5 MG PO TBEC
5.0000 mg | DELAYED_RELEASE_TABLET | Freq: Every day | ORAL | Status: DC | PRN
  Administered 2017-10-19 – 2017-10-22 (×2): 5 mg via ORAL
  Filled 2017-10-19 (×2): qty 1

## 2017-10-19 MED ORDER — SODIUM CHLORIDE 0.9 % IV SOLN
INTRAVENOUS | Status: DC
  Administered 2017-10-19: 06:00:00 via INTRAVENOUS

## 2017-10-19 MED ORDER — DOXAZOSIN MESYLATE 2 MG PO TABS
2.0000 mg | ORAL_TABLET | Freq: Every day | ORAL | Status: DC
  Administered 2017-10-19 – 2017-10-22 (×4): 2 mg via ORAL
  Filled 2017-10-19 (×5): qty 1

## 2017-10-19 MED ORDER — HYDROMORPHONE HCL 2 MG PO TABS
4.0000 mg | ORAL_TABLET | ORAL | Status: DC | PRN
  Administered 2017-10-19 – 2017-10-23 (×18): 4 mg via ORAL
  Filled 2017-10-19 (×19): qty 2

## 2017-10-19 MED ORDER — DOCUSATE SODIUM 100 MG PO CAPS: 100.0000 mg | ORAL_CAPSULE | Freq: Every day | ORAL | Status: DC

## 2017-10-19 MED ORDER — RISAQUAD PO CAPS
1.0000 | ORAL_CAPSULE | Freq: Every day | ORAL | Status: DC
  Administered 2017-10-20 – 2017-10-23 (×4): 1 via ORAL
  Filled 2017-10-19 (×5): qty 1

## 2017-10-19 NOTE — H&P (Signed)
Chief Complaint   Chief Complaint  Patient presents with  . Post-op Problem    HPI   HPI: Douglas Roberts. is a 61 y.o. male who presents to ER for intractable back pain. He is s/p L2-5 laminotomies and decompression by Dr Ellene Route on 10/30. He was doing post operatively until about 24 hours ago. Started having excruciating lower back pain with radiation to anterior thighs. Taking Norco 5-325 two tabs q 4 hours and robaxin '500mg'$  qid without relief. Called the office yesterday and was prescribed oxycodone and steroids but pain continued to worsen so came to ER. He urinated minimally 2 hours ago so bladder scan was performed showing retention requiring foley placement. No bowel dysfunction or rectal paresthesias.   Patient Active Problem List   Diagnosis Date Noted  . Intractable low back pain 10/19/2017  . Lumbar stenosis with neurogenic claudication 10/15/2017    PMH: Past Medical History:  Diagnosis Date  . Hyperlipemia   . Hypertension   . Sleep apnea    no CPAP    PSH: Past Surgical History:  Procedure Laterality Date  . ADENOIDECTOMY    . COLONOSCOPY     x2  . LUMBAR LAMINECTOMY/DECOMPRESSION MICRODISCECTOMY Bilateral 10/15/2017   Procedure: Bilateral Lumbar Two- Three Lumbar Three- Four Lumbar Four- Five Laminectomy/Foraminotomy;  Surgeon: Kristeen Miss, MD;  Location: [facility];  Service: Neurosurgery;  Laterality: Bilateral;  Bilateral L2-3 L3-4 L4-5 Laminectomy/Foraminotomy     (Not in a hospital admission)  SH: Social History  Substance Use Topics  . Smoking status: Never Smoker  . Smokeless tobacco: Never Used  . Alcohol use Yes     Comment: 2-3 drinks/week    MEDS: Prior to Admission medications   Medication Sig Start Date End Date Taking? Authorizing Provider  aspirin 81 MG tablet Take 81 mg by mouth daily at 6 PM.    Yes [provider]  atenolol (TENORMIN) 25 MG tablet Take 25 mg by mouth daily at 6 PM.    Yes [provider]   docusate sodium (COLACE) 100 MG capsule Take 100 mg by mouth daily at 6 (six) AM.   Yes [provider]  doxazosin (CARDURA) 2 MG tablet Take 2 mg by mouth daily at 6 PM. 08/12/17  Yes [provider]  Fiber CAPS Take 2 capsules by mouth 2 (two) times daily. (0600 &1800)   Yes [provider]  fish oil-omega-3 fatty acids 1000 MG capsule Take 2 g by mouth daily at 6 PM.    Yes [provider]  HYDROcodone-acetaminophen (NORCO/VICODIN) 5-325 MG tablet Take 1-2 tablets by mouth every 4 (four) hours as needed for moderate pain ((score 4 to 6)). 10/16/17  Yes Kristeen Miss, MD  ibuprofen (ADVIL,MOTRIN) 200 MG tablet Take 400 mg by mouth every 8 (eight) hours as needed (for pain.).   Yes [provider]  methocarbamol (ROBAXIN) 500 MG tablet Take 1 tablet (500 mg total) by mouth every 6 (six) hours as needed for muscle spasms. 10/16/17  Yes Kristeen Miss, MD  oxyCODONE-acetaminophen (PERCOCET/ROXICET) 5-325 MG tablet Take 1-2 tablets by mouth every 4 (four) hours as needed for severe pain.   Yes [provider]  pravastatin (PRAVACHOL) 40 MG tablet Take 40 mg by mouth daily at 6 PM.    Yes [provider]  Probiotic Product (PROBIOTIC PO) Take 1 capsule by mouth daily at 6 (six) AM.   Yes [provider]  SAW PALMETTO, SERENOA REPENS, PO Take 1 tablet by  mouth daily at 6 PM.    Yes [provider]    ALLERGY: Allergies  Allergen Reactions  . Bee Venom Anaphylaxis    Social History  Substance Use Topics  . Smoking status: Never Smoker  . Smokeless tobacco: Never Used  . Alcohol use Yes     Comment: 2-3 drinks/week     Family History  Problem Relation Age of Onset  . Hypertension Mother   . Dementia Mother      ROS   Review of Systems  Constitutional: Negative for chills and fever.  Eyes: Negative for blurred vision, double vision and photophobia.  Respiratory: Negative.   Gastrointestinal: Negative for  nausea and vomiting.  Musculoskeletal: Positive for back pain and myalgias. Negative for falls, joint pain and neck pain.  Neurological: Positive for tingling. Negative for dizziness, tremors, sensory change, speech change, focal weakness, seizures, loss of consciousness and headaches.     Exam   Vitals:   10/19/17 0215 10/19/17 0345  BP: 127/72 (!) 175/91  Pulse: 77 97  Resp:    Temp:    SpO2: 96% 98%   General appearance: Writhing in pain, unable to get comfortable Eyes: PERRL, Fundoscopic: normal Cardiovascular: Regular rate and rhythm without murmurs, rubs, gallops. No edema or variciosities. Distal pulses normal. Pulmonary: Clear to auscultation Musculoskeletal:     Muscle tone upper extremities: Normal    Muscle tone lower extremities: Normal    Motor exam: Upper Extremities Deltoid Bicep Tricep Grip  Right 5/5 5/5 5/5 5/5  Left 5/5 5/5 5/5 5/5   Lower Extremity IP Quad PF DF EHL  Right Moves anty-gravity; unable to check strength due to pain  5/5 5/5 5/5 5/5  Left Moves anty-gravity; unable to check strength due to pain  5/5 5/5 5/5 5/5   Neurological Awake, alert, oriented Memory and concentration grossly intact Speech fluent, appropriate CNII: Visual fields normal CNIII/IV/VI: EOMI CNV: Facial sensation normal CNVII: Symmetric, normal strength CNVIII: Grossly normal CNIX: Normal palate movement CNXI: Trap and SCM strength normal CN XII: Tongue protrusion normal Sensation grossly intact to LT DTR: Normal Coordination (finger/nose & heel/shin): Normal  Surgical site: Bruising surrounding incision but otherwise c/d/i Flat. No fluctuance. No drainage or signs of infection.   Results - Imaging/Labs   Results for orders placed or performed during the hospital encounter of 10/18/17 (from the past 48 hour(s))  CBC with Differential/Platelet     Status: Abnormal   Collection Time: 10/19/17 12:44 AM  Result Value Ref Range   WBC 6.4 4.0 - 10.5 K/uL   RBC 4.00  (L) 4.22 - 5.81 MIL/uL   Hemoglobin 13.1 13.0 - 17.0 g/dL   HCT 38.4 (L) 39.0 - 52.0 %   MCV 96.0 78.0 - 100.0 fL   MCH 32.8 26.0 - 34.0 pg   MCHC 34.1 30.0 - 36.0 g/dL   RDW 13.0 11.5 - 15.5 %   Platelets 203 150 - 400 K/uL   Neutrophils Relative % 70 %   Neutro Abs 4.4 1.7 - 7.7 K/uL   Lymphocytes Relative 18 %   Lymphs Abs 1.2 0.7 - 4.0 K/uL   Monocytes Relative 11 %   Monocytes Absolute 0.7 0.1 - 1.0 K/uL   Eosinophils Relative 1 %   Eosinophils Absolute 0.1 0.0 - 0.7 K/uL   Basophils Relative 0 %   Basophils Absolute 0.0 0.0 - 0.1 K/uL  Basic metabolic panel     Status: Abnormal   Collection Time: 10/19/17 12:44 AM  Result  Value Ref Range   Sodium 132 (L) 135 - 145 mmol/L   Potassium 3.6 3.5 - 5.1 mmol/L   Chloride 100 (L) 101 - 111 mmol/L   CO2 20 (L) 22 - 32 mmol/L   Glucose, Bld 114 (H) 65 - 99 mg/dL   BUN 11 6 - 20 mg/dL   Creatinine, Ser 0.83 0.61 - 1.24 mg/dL   Calcium 9.5 8.9 - 10.3 mg/dL   GFR calc non Af Amer >60 >60 mL/min   GFR calc Af Amer >60 >60 mL/min    Comment: (NOTE) The eGFR has been calculated using the CKD EPI equation. This calculation has not been validated in all clinical situations. eGFR's persistently <60 mL/min signify possible Chronic Kidney Disease.    Anion gap 12 5 - 15  C-reactive protein     Status: Abnormal   Collection Time: 10/19/17 12:44 AM  Result Value Ref Range   CRP 13.5 (H) <1.0 mg/dL  Sedimentation rate     Status: Abnormal   Collection Time: 10/19/17 12:44 AM  Result Value Ref Range   Sed Rate 44 (H) 0 - 16 mm/hr    No results found.   Impression/Plan   61 y.o. male with acute onset intractable back pain & urinary retention over the past 24 hours - s/p lumbar surgery 10/15/2017. Unfortunately, patient has been unable to get a lumbar MRI due to this pain. Unknown cause for his current back pain without having MRI. No obvious pseudomeningocele on exam.  - Admit for pain control  - Obtain MRI Lumbar spine

## 2017-10-19 NOTE — Progress Notes (Signed)
Pt admitted from ED with c/o of severe back pain, medicated in the ED, pain rating now is 4, settled in bed with call light at bedside, pt and family reassured, will continue to monitor, v/s stable. Obasogie-Asidi, Javeria Briski Efe

## 2017-10-19 NOTE — Progress Notes (Signed)
Coordinated with MRI for patient to have pain medication before MRI. Patient was given 4mg  po Dilaudid and 5mg  IV Valium. Still unable to tolerate MRI. Notified MD. Lawson RadarHeather M Serayah Yazdani

## 2017-10-19 NOTE — ED Notes (Signed)
Pt noted to have greater than 999 ml's via bladder scanner.  Dr. Blinda LeatherwoodPollina notified and verbal order to insert foley given.  Foley placed w/ immediate return of 600 ml's.

## 2017-10-19 NOTE — ED Notes (Signed)
Pt reports being unable to have a BM x 3 days.  Also states that he believes the codeine in his pain medication is making it difficult for him to urinate.

## 2017-10-19 NOTE — Progress Notes (Signed)
Patient ID: Douglas DoomJoseph E Wirz Jr., male   DOB: 11/29/1956, 61 y.o.   MRN: [id number] Subjective:  the patient is alert and pleasant. He complains of back and leg pain. He was not getting adequate relief with oxycodone at home.he complains of constipation.  Objective: Vital signs in last 24 hours: Temp:  [98.4 F (36.9 C)-99.1 F (37.3 C)] 98.4 F (36.9 C) (11/03 0600) Pulse Rate:  [71-97] 85 (11/03 0600) Resp:  [18] 18 (11/03 0600) BP: (105-175)/(55-93) 169/79 (11/03 0600) SpO2:  [92 %-99 %] 99 % (11/03 0600) Weight:  [115.7 kg (255 lb)] 115.7 kg (255 lb) (11/03 0600)  Intake/Output from previous day: 11/02 0701 - 11/03 0700 In: -  Out: 1600 [Urine:1600] Intake/Output this shift: No intake/output data recorded.  Physical exam the patient is alert and pleasant. His strength is grossly normal his lower extremities.  Lab Results:  Recent Labs  10/19/17 0044  WBC 6.4  HGB 13.1  HCT 38.4*  PLT 203   BMET  Recent Labs  10/19/17 0044  NA 132*  K 3.6  CL 100*  CO2 20*  GLUCOSE 114*  BUN 11  CREATININE 0.83  CALCIUM 9.5    Studies/Results: No results found.  Assessment/Plan: Intractable back pain, urinary retention, constipation: I will discontinue the patient's oxycodone as this was not working for him at home. I'll add oral Dilaudid. We will rest his bladder for now. I'll add a suppository for his constipation.  LOS: 0 days     Tannya Gonet D 10/19/2017, 8:37 AM

## 2017-10-19 NOTE — ED Provider Notes (Signed)
[facility] EMERGENCY DEPARTMENT Provider Note   CSN: [id number] Arrival date & time: 10/18/17  2349     History   Chief Complaint Chief Complaint  Patient presents with  . Post-op Problem    HPI Douglas Square. is a 61 y.o. male.  Patient presents to the emergency department for evaluation of low back pain.  Patient had lumbar surgery on October 30.  He was discharged the next day doing well.  He reports that he was ambulatory and having minimal pain.  He started having lower back pain earlier today and this has progressively worsened      Past Medical History:  Diagnosis Date  . Hyperlipemia   . Hypertension   . Sleep apnea    no CPAP    Patient Active Problem List   Diagnosis Date Noted  . Intractable low back pain 10/19/2017  . Lumbar stenosis with neurogenic claudication 10/15/2017    Past Surgical History:  Procedure Laterality Date  . ADENOIDECTOMY    . COLONOSCOPY     x2  . LUMBAR LAMINECTOMY/DECOMPRESSION MICRODISCECTOMY Bilateral 10/15/2017   Procedure: Bilateral Lumbar Two- Three Lumbar Three- Four Lumbar Four- Five Laminectomy/Foraminotomy;  Surgeon: Barnett Abu, MD;  Location: [facility];  Service: Neurosurgery;  Laterality: Bilateral;  Bilateral L2-3 L3-4 L4-5 Laminectomy/Foraminotomy       Home Medications    Prior to Admission medications   Medication Sig Start Date End Date Taking? Authorizing Provider  aspirin 81 MG tablet Take 81 mg by mouth daily at 6 PM.    Yes [provider]  atenolol (TENORMIN) 25 MG tablet Take 25 mg by mouth daily at 6 PM.    Yes [provider]  docusate sodium (COLACE) 100 MG capsule Take 100 mg by mouth daily at 6 (six) AM.   Yes [provider]  doxazosin (CARDURA) 2 MG tablet Take 2 mg by mouth daily at 6 PM. 08/12/17  Yes [provider]  Fiber CAPS Take 2 capsules by mouth 2 (two) times daily. (0600 &1800)   Yes [provider]  fish oil-omega-3  fatty acids 1000 MG capsule Take 2 g by mouth daily at 6 PM.    Yes [provider]  HYDROcodone-acetaminophen (NORCO/VICODIN) 5-325 MG tablet Take 1-2 tablets by mouth every 4 (four) hours as needed for moderate pain ((score 4 to 6)). 10/16/17  Yes Barnett Abu, MD  ibuprofen (ADVIL,MOTRIN) 200 MG tablet Take 400 mg by mouth every 8 (eight) hours as needed (for pain.).   Yes [provider]  methocarbamol (ROBAXIN) 500 MG tablet Take 1 tablet (500 mg total) by mouth every 6 (six) hours as needed for muscle spasms. 10/16/17  Yes Barnett Abu, MD  oxyCODONE-acetaminophen (PERCOCET/ROXICET) 5-325 MG tablet Take 1-2 tablets by mouth every 4 (four) hours as needed for severe pain.   Yes [provider]  pravastatin (PRAVACHOL) 40 MG tablet Take 40 mg by mouth daily at 6 PM.    Yes [provider]  Probiotic Product (PROBIOTIC PO) Take 1 capsule by mouth daily at 6 (six) AM.   Yes [provider]  SAW PALMETTO, SERENOA REPENS, PO Take 1 tablet by mouth daily at 6 PM.    Yes [provider]    Family History Family History  Problem Relation Age of Onset  . Hypertension Mother   . Dementia Mother     Social History Social History  Substance Use Topics  . Smoking status: Never Smoker  .  Smokeless tobacco: Never Used  . Alcohol use Yes     Comment: 2-3 drinks/week     Allergies   Bee venom   Review of Systems Review of Systems  Musculoskeletal: Positive for back pain.  All other systems reviewed and are negative.    Physical Exam Updated Vital Signs BP (!) 105/55   Pulse 89   Temp 99.1 F (37.3 C) (Oral)   Resp 18   SpO2 94%   Physical Exam  Constitutional: He is oriented to person, place, and time. He appears well-developed and well-nourished. No distress.  HENT:  Head: Normocephalic and atraumatic.  Right Ear: Hearing normal.  Left Ear: Hearing normal.  Nose: Nose normal.  Mouth/Throat: Oropharynx is clear and  moist and mucous membranes are normal.  Eyes: Pupils are equal, round, and reactive to light. Conjunctivae and EOM are normal.  Neck: Normal range of motion. Neck supple.  Cardiovascular: Regular rhythm, S1 normal and S2 normal.  Exam reveals no gallop and no friction rub.   No murmur heard. Pulmonary/Chest: Effort normal and breath sounds normal. No respiratory distress. He exhibits no tenderness.  Abdominal: Soft. Normal appearance and bowel sounds are normal. There is no hepatosplenomegaly. There is no tenderness. There is no rebound, no guarding, no tenderness at McBurney's point and negative Murphy's sign. No hernia.  Genitourinary:  Genitourinary Comments: Normal rectal tone  Musculoskeletal: Normal range of motion.  Neurological: He is alert and oriented to person, place, and time. No cranial nerve deficit or sensory deficit. Coordination normal. GCS eye subscore is 4. GCS verbal subscore is 5. GCS motor subscore is 6.  Decreased proximal strength bilateral lower extremity (cannot lift legs off bed while supine) with preserved distal strength  No saddle anesthesia  Skin: Skin is warm, dry and intact. No rash noted. No cyanosis.  Healing lumbar incision, no drainage, erythema or purulence  Psychiatric: He has a normal mood and affect. His speech is normal and behavior is normal. Thought content normal.  Nursing note and vitals reviewed.    ED Treatments / Results  Labs (all labs ordered are listed, but only abnormal results are displayed) Labs Reviewed  CBC WITH DIFFERENTIAL/PLATELET - Abnormal; Notable for the following:       Result Value   RBC 4.00 (*)    HCT 38.4 (*)    All other components within normal limits  BASIC METABOLIC PANEL - Abnormal; Notable for the following:    Sodium 132 (*)    Chloride 100 (*)    CO2 20 (*)    Glucose, Bld 114 (*)    All other components within normal limits  C-REACTIVE PROTEIN - Abnormal; Notable for the following:    CRP 13.5 (*)     All other components within normal limits  SEDIMENTATION RATE - Abnormal; Notable for the following:    Sed Rate 44 (*)    All other components within normal limits  HIV ANTIBODY (ROUTINE TESTING)    EKG  EKG Interpretation None       Radiology No results found.  Procedures Procedures (including critical care time)  Medications Ordered in ED Medications  Fiber CAPS 2 capsule (not administered)  fish oil-omega-3 fatty acids capsule 2 g (not administered)  saw palmetto capsule 80 mg (not administered)  aspirin tablet 81 mg (not administered)  pravastatin (PRAVACHOL) tablet 40 mg (not administered)  atenolol (TENORMIN) tablet 25 mg (not administered)  doxazosin (CARDURA) tablet 2 mg (not administered)  Probiotic CAPS (not administered)  ibuprofen (ADVIL,MOTRIN) tablet 400 mg (not administered)  methocarbamol (ROBAXIN) tablet 500 mg (not administered)  0.9 %  sodium chloride infusion (not administered)  sodium chloride flush (NS) 0.9 % injection 3 mL (not administered)  sodium chloride flush (NS) 0.9 % injection 3 mL (not administered)  0.9 %  sodium chloride infusion (not administered)  acetaminophen (TYLENOL) tablet 650 mg (not administered)    Or  acetaminophen (TYLENOL) suppository 650 mg (not administered)  oxyCODONE (Oxy IR/ROXICODONE) immediate release tablet 5 mg (not administered)  zolpidem (AMBIEN) tablet 5 mg (not administered)  docusate sodium (COLACE) capsule 100 mg (not administered)  senna-docusate (Senokot-S) tablet 1 tablet (not administered)  bisacodyl (DULCOLAX) EC tablet 5 mg (not administered)  magnesium citrate solution 1 Bottle (not administered)  ondansetron (ZOFRAN) tablet 4 mg (not administered)    Or  ondansetron (ZOFRAN) injection 4 mg (not administered)  HYDROmorphone (DILAUDID) injection 1 mg (not administered)  naloxone (NARCAN) injection 0.4 mg (not administered)  diazepam (VALIUM) injection 5 mg (not administered)  HYDROmorphone  (DILAUDID) injection 1 mg (1 mg Intravenous Given 10/19/17 0104)  ondansetron (ZOFRAN) injection 4 mg (4 mg Intravenous Given 10/19/17 0104)  HYDROmorphone (DILAUDID) injection 1 mg (1 mg Intravenous Given 10/19/17 0132)  HYDROmorphone (DILAUDID) injection 1 mg (1 mg Intravenous Given 10/19/17 0218)  LORazepam (ATIVAN) injection 0.5 mg (0.5 mg Intravenous Given 10/19/17 0218)  HYDROmorphone (DILAUDID) injection 2 mg (2 mg Intravenous Given 10/19/17 0334)     Initial Impression / Assessment and Plan / ED Course  I have reviewed the triage vital signs and the nursing notes.  Pertinent labs & imaging results that were available during my care of the patient were reviewed by me and considered in my medical decision making (see chart for details).    Patient with increased low back pain over the course of 1 day.  Patient just underwent lumbar surgery.  He reports that he had been doing well after surgery, pain began today but there has not been any injury.  Patient experiencing severe constant pain that worsens with any movement.  Patient has not achieved any significant pain control with multiple doses of Dilaudid.  MRI has been ordered, but patient was unable to lie flat for the imaging.  There is no external sign of infection or problems with the surgical site.  He does, however, have decreased proximal strength in the lower extremities and he was noted to have a full bladder, concerning for urinary retention.  Neurosurgery consulted, will see patient in ER.   Final Clinical Impressions(s) / ED Diagnoses   Final diagnoses:  Postoperative pain    New Prescriptions New Prescriptions   No medications on file     Gilda CreasePollina, Robin Petrakis J, MD 10/19/17 [phone]

## 2017-10-20 ENCOUNTER — Other Ambulatory Visit: Payer: Self-pay

## 2017-10-20 DIAGNOSIS — Z7982 Long term (current) use of aspirin: Secondary | ICD-10-CM | POA: Diagnosis not present

## 2017-10-20 DIAGNOSIS — Z8249 Family history of ischemic heart disease and other diseases of the circulatory system: Secondary | ICD-10-CM | POA: Diagnosis not present

## 2017-10-20 DIAGNOSIS — M79651 Pain in right thigh: Secondary | ICD-10-CM | POA: Diagnosis present

## 2017-10-20 DIAGNOSIS — M79652 Pain in left thigh: Secondary | ICD-10-CM | POA: Diagnosis present

## 2017-10-20 DIAGNOSIS — K5900 Constipation, unspecified: Secondary | ICD-10-CM | POA: Diagnosis present

## 2017-10-20 DIAGNOSIS — M545 Low back pain: Secondary | ICD-10-CM | POA: Diagnosis present

## 2017-10-20 DIAGNOSIS — R339 Retention of urine, unspecified: Secondary | ICD-10-CM | POA: Diagnosis present

## 2017-10-20 DIAGNOSIS — Z981 Arthrodesis status: Secondary | ICD-10-CM | POA: Diagnosis not present

## 2017-10-20 DIAGNOSIS — E785 Hyperlipidemia, unspecified: Secondary | ICD-10-CM | POA: Diagnosis present

## 2017-10-20 DIAGNOSIS — I10 Essential (primary) hypertension: Secondary | ICD-10-CM | POA: Diagnosis present

## 2017-10-20 DIAGNOSIS — G8918 Other acute postprocedural pain: Secondary | ICD-10-CM | POA: Diagnosis present

## 2017-10-20 MED ORDER — CALCIUM CARBONATE ANTACID 500 MG PO CHEW
1.0000 | CHEWABLE_TABLET | Freq: Every day | ORAL | Status: DC
  Administered 2017-10-20 – 2017-10-23 (×3): 200 mg via ORAL
  Filled 2017-10-20 (×4): qty 1

## 2017-10-20 NOTE — Plan of Care (Signed)
Pt progressing

## 2017-10-20 NOTE — Progress Notes (Signed)
Pt pain controlled with PRN pain meds. Large BM on 11/03 resulting from Fleets enema. Foley in place d/t retention. Surgical site bruised, no redness, heat, or swelling.

## 2017-10-20 NOTE — Progress Notes (Signed)
Neurosurgery Progress Note  No issues overnight. Pain much improved this morning. Had a BM yesterday No weakness or N/T in extremities  EXAM:  BP (!) 126/59 (BP Location: Left Arm)   Pulse 77   Temp 99.3 F (37.4 C) (Oral)   Resp 18   Ht 6\' 2"  (1.88 m)   Wt 115.7 kg (255 lb)   SpO2 97%   BMI 32.74 kg/m   Awake, alert, oriented  Speech fluent, appropriate  MAEW with good strength  PLAN Much improved this am Reviewed with BJD. No need for repeat MRI at this time Will remove Foley Work with therapy

## 2017-10-20 NOTE — Progress Notes (Signed)
No acute events Pain much better Moving legs well Catheter in place No need for MRI at this point Remove catheter Likely home tomorrow

## 2017-10-21 ENCOUNTER — Inpatient Hospital Stay (HOSPITAL_COMMUNITY): Payer: BC Managed Care – PPO

## 2017-10-21 MED ORDER — CARISOPRODOL 350 MG PO TABS
350.0000 mg | ORAL_TABLET | Freq: Four times a day (QID) | ORAL | Status: DC
  Administered 2017-10-21 – 2017-10-23 (×6): 350 mg via ORAL
  Filled 2017-10-21 (×7): qty 1

## 2017-10-21 MED ORDER — CELECOXIB 200 MG PO CAPS
200.0000 mg | ORAL_CAPSULE | Freq: Two times a day (BID) | ORAL | Status: DC
  Administered 2017-10-21 – 2017-10-23 (×4): 200 mg via ORAL
  Filled 2017-10-21 (×5): qty 1

## 2017-10-21 MED ORDER — GABAPENTIN 300 MG PO CAPS
300.0000 mg | ORAL_CAPSULE | Freq: Two times a day (BID) | ORAL | Status: DC
  Administered 2017-10-21 – 2017-10-23 (×4): 300 mg via ORAL
  Filled 2017-10-21 (×4): qty 1

## 2017-10-21 NOTE — Evaluation (Signed)
Physical Therapy Evaluation Patient Details Name: Douglas DoomJoseph E Moss Jr. MRN: [id number] DOB: 10/02/1956 Today's Date: 10/21/2017   History of Present Illness  Pt is a 61 y.o. male who presents to ER for intractable back pain. He is s/p L2-5 laminotomies and decompression by Dr Danielle DessElsner on 10/30. He was doing post operatively until about 24 hours ago. Started having excruciating lower back pain with radiation to anterior thighs. Taking Norco 5-325 two tabs q 4 hours and robaxin 500mg  qid without relief. Called the office yesterday and was prescribed oxycodone and steroids but pain continued to worsen so came to ER. To date unable to complete MRI due to pain.   Clinical Impression  Patient is s/p above surgery resulting in functional limitations due to the deficits listed below (see PT Problem List). Pt is limited by increased pain with movement in his back and hips, decreased sensation and increased hip weakness. Pt is currently minA for bed mobility, supervision for transfers and hands on min guard for ambulation of 40 feet with RW. Pain, and tingling in feet increased with ambulation distance. Patient will benefit from skilled PT to increase their independence and safety with mobility to allow discharge to the venue listed below.       Follow Up Recommendations Supervision/Assistance - 24 hour;Home health PT    Equipment Recommendations  Rolling walker with 5" wheels;3in1 (PT);Hospital bed;Other (comment)(hospital bed rental)    Recommendations for Other Services       Precautions / Restrictions Precautions Precautions: Back Precaution Comments: Able to recall 3/3 back precautions.  Required Braces or Orthoses: Spinal Brace Spinal Brace: Lumbar corset;Applied in sitting position Restrictions Weight Bearing Restrictions: No      Mobility  Bed Mobility Overal bed mobility: Needs Assistance Bed Mobility: Rolling;Sidelying to Sit;Sit to Sidelying Rolling: Supervision Sidelying to sit: Min  assist     Sit to sidelying: Supervision General bed mobility comments: minA for trunk to upright in sidelying to sit otherwise bed mobility supervision  Transfers Overall transfer level: Needs assistance   Transfers: Sit to/from Stand Sit to Stand: Min guard;From elevated surface         General transfer comment: min guard for safety, good hand placement, power up and steadying  Ambulation/Gait Ambulation/Gait assistance: Min guard Ambulation Distance (Feet): 40 Feet Assistive device: Rolling walker (2 wheeled) Gait Pattern/deviations: Step-through pattern;Decreased stride length;Shuffle;Antalgic;Trunk flexed Gait velocity: Decreased  Gait velocity interpretation: Below normal speed for age/gender General Gait Details: hands on minguard for safety, slow guarded gait, with decreased foot clearance and forward flexed trunk, pt unable to stand upright despite verbal cuing, pt reports increases his pain and radicular symptoms         Balance Overall balance assessment: Needs assistance Sitting-balance support: No upper extremity supported;Feet supported Sitting balance-Leahy Scale: Good Sitting balance - Comments: flexed trunk   Standing balance support: Bilateral upper extremity supported Standing balance-Leahy Scale: Poor Standing balance comment: requires RW assist for balance                             Pertinent Vitals/Pain Pain Assessment: 0-10 Pain Score: 4 (2/10 at rest and 4/10 with ambulation) Pain Location: back, anterior thighs and radiating to feet with ambulation  Pain Descriptors / Indicators: Sharp;Pins and needles;Tingling(sharp at operative site, pins and needles in thighs and feet)    Home Living Family/patient expects to be discharged to:: Private residence Living Arrangements: Spouse/significant other Available Help at Discharge: Family;Available 24  hours/day Type of Home: House Home Access: Stairs to enter Entrance Stairs-Rails:  None Entrance Stairs-Number of Steps: 3 Home Layout: Two level Home Equipment: Toilet riser      Prior Function Level of Independence: Independent         Comments: prior to surgery independent, since discharge unable to ambulate without increased pain and requires help for ADLs, and iADLs.        Extremity/Trunk Assessment   Upper Extremity Assessment Upper Extremity Assessment: Overall WFL for tasks assessed    Lower Extremity Assessment Lower Extremity Assessment: RLE deficits/detail;LLE deficits/detail RLE Deficits / Details: ROM WFL, strength- hip flexion 4-/5, knees and ankles 4+/5, decreased sensation in anterior thighs  RLE Sensation: decreased light touch(thighs) LLE Deficits / Details: ROM WFL, strength- hip flexion 4-/5, knees and ankles 4+/5, decreased sensation in anterior thighs  LLE Sensation: decreased light touch(thighs)    Cervical / Trunk Assessment Cervical / Trunk Assessment: Kyphotic Cervical / Trunk Exceptions: s/p laminectomy and microdiskectomy  Communication   Communication: No difficulties  Cognition Arousal/Alertness: Awake/alert Behavior During Therapy: WFL for tasks assessed/performed Overall Cognitive Status: Within Functional Limits for tasks assessed                                               Assessment/Plan    PT Assessment Patient needs continued PT services  PT Problem List Decreased balance;Decreased strength;Decreased activity tolerance;Decreased mobility;Impaired sensation;Pain       PT Treatment Interventions Gait training;Stair training;DME instruction;Functional mobility training;Therapeutic activities;Therapeutic exercise;Balance training;Patient/family education    PT Goals (Current goals can be found in the Care Plan section)  Acute Rehab PT Goals Patient Stated Goal: to decrease pain with movement PT Goal Formulation: With patient Time For Goal Achievement: 11/04/17 Potential to Achieve Goals:  Fair    Frequency Min 3X/week    AM-PAC PT "6 Clicks" Daily Activity  Outcome Measure Difficulty turning over in bed (including adjusting bedclothes, sheets and blankets)?: A Lot Difficulty moving from lying on back to sitting on the side of the bed? : Unable Difficulty sitting down on and standing up from a chair with arms (e.g., wheelchair, bedside commode, etc,.)?: A Little Help needed moving to and from a bed to chair (including a wheelchair)?: A Little Help needed walking in hospital room?: A Little Help needed climbing 3-5 steps with a railing? : A Lot 6 Click Score: 14    End of Session Equipment Utilized During Treatment: Back brace Activity Tolerance: Patient limited by pain Patient left: in bed;with call bell/phone within reach;with family/visitor present Nurse Communication: Mobility status PT Visit Diagnosis: Unsteadiness on feet (R26.81);Other abnormalities of gait and mobility (R26.89);Difficulty in walking, not elsewhere classified (R26.2);Other symptoms and signs involving the nervous system (R29.898);Muscle weakness (generalized) (M62.81);Pain Pain - Right/Left: (back ) Pain - part of body: Hip(bilateral hips and back)    Time: 1610-9604 PT Time Calculation (min) (ACUTE ONLY): 26 min   Charges:   PT Evaluation $PT Eval Moderate Complexity: 1 Mod PT Treatments $Gait Training: 8-22 mins   PT G Codes:        Trinika Cortese B. Beverely Risen PT, DPT Acute Rehabilitation  [phone] Pager [phone]    Douglas Roberts 10/21/2017, 11:22 AM

## 2017-10-21 NOTE — Progress Notes (Signed)
Vital signs are stable Back pain is still significant but improving CT scan done today demonstrates that there is good decompression at L2-3 3445 no evidence of any facet fractures I suspect underlying problem is inflammation Will continue his medications

## 2017-10-22 NOTE — Progress Notes (Signed)
Physical Therapy Treatment Patient Details Name: Douglas DoomJoseph E Balboni Jr. MRN: [id number] DOB: 03/30/1956 Today's Date: 10/22/2017    History of Present Illness Pt is a 61 y.o. male who presents to ER for intractable back pain. He is s/p L2-5 laminotomies and decompression by Dr Danielle DessElsner on 10/30. He was doing post operatively until about 24 hours ago. Started having excruciating lower back pain with radiation to anterior thighs. Taking Norco 5-325 two tabs q 4 hours and robaxin 500mg  qid without relief. Called the office yesterday and was prescribed oxycodone and steroids but pain continued to worsen so came to ER. To date unable to complete MRI due to pain.     PT Comments    Pt is making good progress towards his goals and is able to move with less pain today. Pt educated on need to adhere to bending, lifting or twisting precautions for reduced irritation of the surgical site. Pt currently supervision for bed mobility, and min guard for transfers, ambulation with RW and ascent/descent of 15 steps.    Follow Up Recommendations  Supervision/Assistance - 24 hour;Home health PT     Equipment Recommendations  Rolling walker with 5" wheels;3in1 (PT);Hospital bed;Other (comment)(hospital bed rental)    Recommendations for Other Services       Precautions / Restrictions Precautions Precautions: Back Precaution Comments: Able to recall 3/3 back precautions.  Required Braces or Orthoses: Spinal Brace Spinal Brace: Lumbar corset;Applied in sitting position Restrictions Weight Bearing Restrictions: No    Mobility  Bed Mobility Overal bed mobility: Needs Assistance Bed Mobility: Rolling;Sidelying to Sit;Sit to Sidelying Rolling: Supervision Sidelying to sit: Supervision       General bed mobility comments: supervision for bed mobility, vc for coming all the way over onto hip before pushing up to seated to reduce twist on his back   Transfers Overall transfer level: Needs assistance    Transfers: Sit to/from Stand Sit to Stand: Min guard;From elevated surface         General transfer comment: min guard for safety, good hand placement, power up and steadying  Ambulation/Gait Ambulation/Gait assistance: Min guard Ambulation Distance (Feet): 200 Feet Assistive device: Rolling walker (2 wheeled) Gait Pattern/deviations: Step-through pattern;Decreased stride length;Shuffle;Antalgic;Trunk flexed Gait velocity: Decreased  Gait velocity interpretation: Below normal speed for age/gender General Gait Details: min guard for safety, vc for upright posture, staying within the RW, increased knee, hip flexion to clear feet, slow, steady gait with cadence increase over yesterday   Stairs Stairs: Yes   Stair Management: Two rails;Step to pattern;Alternating pattern;Forwards Number of Stairs: 15 General stair comments: initially min guard with step to pattern, progressed to step over step with supervision as patient became more comfortable   Wheelchair Mobility    Modified Rankin (Stroke Patients Only)       Balance Overall balance assessment: Needs assistance Sitting-balance support: No upper extremity supported;Feet supported Sitting balance-Leahy Scale: Good Sitting balance - Comments: flexed trunk   Standing balance support: Bilateral upper extremity supported Standing balance-Leahy Scale: Poor Standing balance comment: requires RW assist for balance                            Cognition Arousal/Alertness: Awake/alert Behavior During Therapy: WFL for tasks assessed/performed Overall Cognitive Status: Within Functional Limits for tasks assessed  Exercises Other Exercises Other Exercises: single leg stance, bilateral, to count of 10 x 5 Other Exercises: Rhomberg stance 30 sec     General Comments  Wife present throughout session.      Pertinent Vitals/Pain Pain Assessment: Faces Faces  Pain Scale: Hurts even more Pain Location: back, anterior thighs and radiating to feet with ambulation  Pain Descriptors / Indicators: Sharp;Shooting(ocassional sharp, shooting pain at surgical site with moveme)    Home Living                      Prior Function            PT Goals (current goals can now be found in the care plan section) Acute Rehab PT Goals Patient Stated Goal: to decrease pain with movement PT Goal Formulation: With patient Time For Goal Achievement: 11/04/17 Potential to Achieve Goals: Fair Progress towards PT goals: Progressing toward goals    Frequency    Min 3X/week      PT Plan Current plan remains appropriate    Co-evaluation              AM-PAC PT "6 Clicks" Daily Activity  Outcome Measure  Difficulty turning over in bed (including adjusting bedclothes, sheets and blankets)?: A Lot Difficulty moving from lying on back to sitting on the side of the bed? : A Lot Difficulty sitting down on and standing up from a chair with arms (e.g., wheelchair, bedside commode, etc,.)?: A Little Help needed moving to and from a bed to chair (including a wheelchair)?: A Little Help needed walking in hospital room?: A Little Help needed climbing 3-5 steps with a railing? : A Lot 6 Click Score: 15    End of Session Equipment Utilized During Treatment: Back brace Activity Tolerance: Patient limited by pain Patient left: in bed;with call bell/phone within reach;with family/visitor present Nurse Communication: Mobility status PT Visit Diagnosis: Unsteadiness on feet (R26.81);Other abnormalities of gait and mobility (R26.89);Difficulty in walking, not elsewhere classified (R26.2);Other symptoms and signs involving the nervous system (R29.898);Muscle weakness (generalized) (M62.81);Pain Pain - Right/Left: (back ) Pain - part of body: Hip(bilateral hips and back)     Time: [id number] PT Time Calculation (min) (ACUTE ONLY): 33 min  Charges:  $Gait  Training: 23-37 mins                    G Codes:       Milena Liggett B. Beverely RisenVan Fleet PT, DPT Acute Rehabilitation  [phone] Pager [phone]  Elon Alaslizabeth B Van Fleet 10/22/2017, 10:55 AM

## 2017-10-22 NOTE — Progress Notes (Signed)
Patient requesting hospital bed for home. He inquired about having a fully electric bed. CM called Brad with [facility] and a fully electric bed would be $160/ month out of pocket. Pts wife aware. Pt with orders for walker and 3 in 1 but awaiting bed order to place all DME orders.  PT recommending HH services. MD please order if appropriate.  CM called and left message on Aram BeechamCynthia (Dr Verlee RossettiElsner's secretary) voice mail asking for orders for the above. CM following.

## 2017-10-23 MED ORDER — ZOLPIDEM TARTRATE 5 MG PO TABS: 5.0000 mg | ORAL_TABLET | Freq: Every evening | ORAL | 0 refills | Status: DC | PRN

## 2017-10-23 MED ORDER — GABAPENTIN 300 MG PO CAPS: 300.0000 mg | ORAL_CAPSULE | Freq: Two times a day (BID) | ORAL | 3 refills | Status: DC

## 2017-10-23 MED ORDER — METHOCARBAMOL 500 MG PO TABS: 500.0000 mg | ORAL_TABLET | Freq: Four times a day (QID) | ORAL | 3 refills | Status: DC | PRN

## 2017-10-23 MED ORDER — HYDROMORPHONE HCL 2 MG PO TABS: ORAL_TABLET | ORAL | 0 refills | Status: DC

## 2017-10-23 MED ORDER — CELECOXIB 200 MG PO CAPS: 200.0000 mg | ORAL_CAPSULE | Freq: Two times a day (BID) | ORAL | 3 refills | Status: DC

## 2017-10-23 NOTE — Progress Notes (Signed)
Physical Therapy Treatment Patient Details Name: Douglas DoomJoseph E Clowdus Jr. MRN: [id number] DOB: 01/01/1956 Today's Date: 10/23/2017    History of Present Illness Pt is a 61 y.o. male who presents to ER for intractable back pain. He is s/p L2-5 laminotomies and decompression by Dr Danielle DessElsner on 10/30. He was doing post operatively until about 24 hours ago. Started having excruciating lower back pain with radiation to anterior thighs. Taking Norco 5-325 two tabs q 4 hours and robaxin 500mg  qid without relief. Called the office yesterday and was prescribed oxycodone and steroids but pain continued to worsen so came to ER. To date unable to complete MRI due to pain.     PT Comments    Pt progressing well today. Pt is supervision for all mobility including ascent/descent of flight of stairs. Pt able to recall back precautions and educated on especially being careful of twisting. Pt will discharge home this morning.    Follow Up Recommendations  Supervision/Assistance - 24 hour;No PT follow up     Equipment Recommendations  Rolling walker with 5" wheels;3in1 (PT);Hospital bed;Other (comment)(hospital bed rental)    Recommendations for Other Services       Precautions / Restrictions Precautions Precautions: Back Precaution Comments: Able to recall 3/3 back precautions.  Required Braces or Orthoses: Spinal Brace Spinal Brace: Lumbar corset;Applied in sitting position Restrictions Weight Bearing Restrictions: No    Mobility  Bed Mobility Overal bed mobility: Needs Assistance Bed Mobility: Rolling;Sidelying to Sit;Sit to Sidelying Rolling: Supervision Sidelying to sit: Supervision       General bed mobility comments: supervision for bed mobility, good form with coming into sidelying and pushing up to seated  Transfers Overall transfer level: Needs assistance   Transfers: Sit to/from Stand Sit to Stand: Supervision         General transfer comment: supervision for safety, good power up  and steadying [facility]  Ambulation/Gait Ambulation/Gait assistance: Supervision Ambulation Distance (Feet): 250 Feet Assistive device: Rolling walker (2 wheeled) Gait Pattern/deviations: Step-through pattern;Decreased stride length;Shuffle;Antalgic;Trunk flexed Gait velocity: WNL Gait velocity interpretation: >2.62 ft/sec, indicative of independent community ambulator General Gait Details: min guard for safety, vc for upright posture   Stairs Stairs: Yes   Stair Management: Two rails;Alternating pattern;Forwards Number of Stairs: 12 General stair comments: supervision for ascent/descent of entire flight of stairs     Balance Overall balance assessment: Needs assistance Sitting-balance support: No upper extremity supported;Feet supported Sitting balance-Leahy Scale: Good Sitting balance - Comments: flexed trunk   Standing balance support: Bilateral upper extremity supported Standing balance-Leahy Scale: Fair Standing balance comment: able to statically stand beside bed without UE support                            Cognition Arousal/Alertness: Awake/alert Behavior During Therapy: WFL for tasks assessed/performed Overall Cognitive Status: Within Functional Limits for tasks assessed                                               Pertinent Vitals/Pain Pain Assessment: 0-10 Pain Score: 1  Pain Location: back  Pain Descriptors / Indicators: Sharp;Shooting(ocassional sharp, shooting pain at surgical site with moveme) Pain Intervention(s): Monitored during session;Limited activity within patient's tolerance    Home Living  Prior Function            PT Goals (current goals can now be found in the care plan section) Acute Rehab PT Goals Patient Stated Goal: to decrease pain with movement PT Goal Formulation: With patient Time For Goal Achievement: 11/04/17 Potential to Achieve Goals: Good Progress towards PT goals:  Progressing toward goals    Frequency    Min 3X/week      PT Plan Discharge plan needs to be updated       AM-PAC PT "6 Clicks" Daily Activity  Outcome Measure  Difficulty turning over in bed (including adjusting bedclothes, sheets and blankets)?: A Little Difficulty moving from lying on back to sitting on the side of the bed? : A Little Difficulty sitting down on and standing up from a chair with arms (e.g., wheelchair, bedside commode, etc,.)?: A Little Help needed moving to and from a bed to chair (including a wheelchair)?: A Little Help needed walking in hospital room?: A Little Help needed climbing 3-5 steps with a railing? : A Little 6 Click Score: 18    End of Session Equipment Utilized During Treatment: Back brace Activity Tolerance: Patient tolerated treatment well Patient left: in bed;with call bell/phone within reach;with family/visitor present Nurse Communication: Mobility status PT Visit Diagnosis: Other symptoms and signs involving the nervous system (R29.898);Muscle weakness (generalized) (M62.81);Pain Pain - Right/Left: (back ) Pain - part of body: Hip(bilateral hips and back)     Time: 1012-1030 PT Time Calculation (min) (ACUTE ONLY): 18 min  Charges:  $Gait Training: 8-22 mins                    G Codes:       Douglas Roberts PT, DPT Acute Rehabilitation  [phone] Pager [phone]     Douglas Roberts 10/23/2017, 3:14 PM

## 2017-10-23 NOTE — Progress Notes (Signed)
Patient discharged home with spouse. Discharge information given. IV removed. Patient questions asked and answered. Paper prescriptions given to patient and aware non-narcotic meds called to pharmacy. Will transport from unit via wheelchair with volunteer services. Lawson RadarHeather M Deshaun Schou

## 2017-10-23 NOTE — Discharge Summary (Signed)
Date of admission: 10/18/2017 Date of discharge: 10/23/2017 Condition on discharge: Improved Admitting diagnosis: Intractable pain status post lumbar laminectomy for stenosis L2-3 L3-4 L4-5 Discharge and final diagnosis: Intractable pain status post lumbar laminectomy for stenosis L2-3 L3-4 L4-5 Hospital course: Patient is a 61 year old individual who had undergone bilateral laminotomies and decompression at L2-3 L3-4 L4-5 approximately 4 days prior o his admission. The first 2 days he had good relief of pain but then pain became substantial uncontrollable despite use of hydrocodone for pain and Robaxin as a muscle relaxer.He was admitted to hospital for better pain control and responded well to Dilaudid. A CT scan of the lumbar spine demonstrated that the patient had no evidence of facet fracture or other anatomic abnormality in the surgical bed.Gradually was mobilized and the addition of Celebrex and Neurontin helped to control the pain a bit better. Constipation issues were addressed with the use ofpotent laxatives and his situation overall improved. Is discharged home now with a prescription for Dilaudid 2-4 mg as needed for pain control Robaxin 500 mg every 6 hours as needed for pain control Neurontin 3 mg twice daily and Celebrex 200 mg twice daily. He'll be seen in follow-up on the 14th.

## 2017-10-23 NOTE — Progress Notes (Signed)
Pt discharging home with self care. PT recommending HH services but patient currently does not feel he needs HH PT. CM encouraged him to call Dr Verlee RossettiElsner's office if her gets home and changes his mind.  Pt with orders for hospital bed, tall walker and 3 in 1. [facility] Walker delivered to the room. 3 in 1 and bed will be delivered to his home between 3-7 pm today.  Wife to provide transportation home.

## 2017-10-23 NOTE — Care Management (Signed)
    Durable Medical Equipment  (From admission, onward)        Start     Ordered   10/23/17 0710  For home use only DME Hospital bed  Once    Question Answer Comment  The above medical condition requires: Patient requires the ability to reposition frequently   Head must be elevated greater than: 45 degrees   Bed type Semi-electric      10/23/17 0709   10/22/17 1119  For home use only DME 3 n 1  Once     10/22/17 1119   10/22/17 1119  For home use only DME Walker tall  Once    Question:  Patient needs a walker to treat with the following condition  Answer:  Status post lumbar surgery   10/22/17 1119

## 2018-07-15 ENCOUNTER — Other Ambulatory Visit: Payer: Self-pay | Admitting: Neurological Surgery

## 2018-07-15 DIAGNOSIS — M48062 Spinal stenosis, lumbar region with neurogenic claudication: Secondary | ICD-10-CM

## 2018-07-24 ENCOUNTER — Ambulatory Visit
Admission: RE | Admit: 2018-07-24 | Discharge: 2018-07-24 | Disposition: A | Discharge status: Home / Self Care | Payer: BC Managed Care – PPO | Source: Ambulatory Visit | Attending: Neurological Surgery | Admitting: Neurological Surgery

## 2018-07-24 DIAGNOSIS — M48062 Spinal stenosis, lumbar region with neurogenic claudication: Secondary | ICD-10-CM

## 2018-07-24 MED ORDER — GADOBENATE DIMEGLUMINE 529 MG/ML IV SOLN
20.0000 mL | Freq: Once | INTRAVENOUS | Status: AC | PRN
  Administered 2018-07-24: 20 mL via INTRAVENOUS

## 2018-09-05 ENCOUNTER — Other Ambulatory Visit: Payer: Self-pay | Admitting: Neurological Surgery

## 2018-10-09 ENCOUNTER — Other Ambulatory Visit (HOSPITAL_COMMUNITY): Payer: Self-pay | Admitting: Neurological Surgery

## 2018-10-09 DIAGNOSIS — M48062 Spinal stenosis, lumbar region with neurogenic claudication: Secondary | ICD-10-CM

## 2018-10-27 ENCOUNTER — Ambulatory Visit (HOSPITAL_COMMUNITY)
Admission: RE | Admit: 2018-10-27 | Discharge: 2018-10-27 | Disposition: A | Discharge status: Home / Self Care | Payer: BC Managed Care – PPO | Source: Ambulatory Visit | Attending: Neurological Surgery | Admitting: Neurological Surgery

## 2018-10-27 DIAGNOSIS — I700 Atherosclerosis of aorta: Secondary | ICD-10-CM | POA: Insufficient documentation

## 2018-10-27 DIAGNOSIS — M48062 Spinal stenosis, lumbar region with neurogenic claudication: Secondary | ICD-10-CM

## 2018-11-03 ENCOUNTER — Encounter (HOSPITAL_COMMUNITY): Payer: Self-pay

## 2018-11-03 ENCOUNTER — Encounter (HOSPITAL_COMMUNITY)
Admission: RE | Admit: 2018-11-03 | Discharge: 2018-11-03 | Disposition: A | Discharge status: Home / Self Care | Payer: BC Managed Care – PPO | Source: Ambulatory Visit | Attending: Neurological Surgery | Admitting: Neurological Surgery

## 2018-11-03 ENCOUNTER — Other Ambulatory Visit: Payer: Self-pay

## 2018-11-03 DIAGNOSIS — Z01818 Encounter for other preprocedural examination: Secondary | ICD-10-CM | POA: Diagnosis not present

## 2018-11-03 DIAGNOSIS — I10 Essential (primary) hypertension: Secondary | ICD-10-CM | POA: Diagnosis not present

## 2018-11-03 LAB — TYPE AND SCREEN
ABO/RH(D): A POS
ANTIBODY SCREEN: NEGATIVE

## 2018-11-03 LAB — CBC
HEMATOCRIT: 49 % (ref 39.0–52.0)
Hemoglobin: 15.4 g/dL (ref 13.0–17.0)
MCH: 31.4 pg (ref 26.0–34.0)
MCHC: 31.4 g/dL (ref 30.0–36.0)
MCV: 99.8 fL (ref 80.0–100.0)
Platelets: 218 10*3/uL (ref 150–400)
RBC: 4.91 MIL/uL (ref 4.22–5.81)
RDW: 13 % (ref 11.5–15.5)
WBC: 4.7 10*3/uL (ref 4.0–10.5)
nRBC: 0 % (ref 0.0–0.2)

## 2018-11-03 LAB — ABO/RH: ABO/RH(D): A POS

## 2018-11-03 LAB — BASIC METABOLIC PANEL
Anion gap: 11 (ref 5–15)
BUN: 10 mg/dL (ref 8–23)
CO2: 26 mmol/L (ref 22–32)
Calcium: 9.7 mg/dL (ref 8.9–10.3)
Chloride: 99 mmol/L (ref 98–111)
Creatinine, Ser: 1.18 mg/dL (ref 0.61–1.24)
GFR calc Af Amer: >60 mL/min (ref >60)
GFR calc non Af Amer: >60 mL/min (ref >60)
Glucose, Bld: 118 mg/dL — ABNORMAL HIGH (ref 70–99)
Potassium: 4.2 mmol/L (ref 3.5–5.1)
Sodium: 136 mmol/L (ref 135–145)

## 2018-11-03 LAB — SURGICAL PCR SCREEN
MRSA, PCR: POSITIVE — AB
Staphylococcus aureus: POSITIVE — AB

## 2018-11-03 NOTE — Progress Notes (Signed)
PCP - Dr. Clarene DukeLittle Cardiologist - denies  EKG - 11/03/2018   Patient denies shortness of breath, fever, cough and chest pain at PAT appointment   Patient verbalized understanding of instructions that were given to them at the PAT appointment. Patient was also instructed that they will need to review over the PAT instructions again at home before surgery.

## 2018-11-03 NOTE — Pre-Procedure Instructions (Signed)
Douglas DoomJoseph E Wynter Jr.  11/03/2018      [id number] Douglas Roberts- [place], [place] - [address] [place] [address] Phone: [phone] Fax: [phone]    Your procedure is scheduled on Tuesday November 26.  [facility] at 5:30 A.M.  Call this number if you have problems the morning of surgery:  [phone]   Remember:  Do not eat or drink after midnight.    Take these medicines the morning of surgery with A SIP OF WATER: NONE, nasal spray if needed  7 days prior to surgery STOP taking any Aspirin(unless otherwise instructed by your surgeon), Aleve, Naproxen, Ibuprofen, Motrin, Advil, Goody's, BC's, all herbal medications, fish oil, and all vitamins       Do not wear jewelry  Do not wear lotions, powders, or colognes, or deodorant.  Do not shave 48 hours prior to surgery.  Men may shave face and neck.  Do not bring valuables to the hospital.  [facility] is not responsible for any belongings or valuables.  Contacts, dentures or bridgework may not be worn into surgery.  Leave your suitcase in the car.  After surgery it may be brought to your room.  For patients admitted to the hospital, discharge time will be determined by your treatment team.  Patients discharged the day of surgery will not be allowed to drive home.   Special instructions:    [place]- Preparing For Surgery  Before surgery, you can play an important role. Because skin is not sterile, your skin needs to be as free of germs as possible. You can reduce the number of germs on your skin by washing with CHG (chlorahexidine gluconate) Soap before surgery.  CHG is an antiseptic cleaner which kills germs and bonds with the skin to continue killing germs even after washing.    Oral Hygiene is also important to reduce your risk of infection.  Remember - BRUSH YOUR TEETH THE MORNING OF SURGERY WITH YOUR REGULAR TOOTHPASTE  Please do not use if you have an allergy to  CHG or antibacterial soaps. If your skin becomes reddened/irritated stop using the CHG.  Do not shave (including legs and underarms) for at least 48 hours prior to first CHG shower. It is OK to shave your face.  Please follow these instructions carefully.   1. Shower the NIGHT BEFORE SURGERY and the MORNING OF SURGERY with CHG.   2. If you chose to wash your hair, wash your hair first as usual with your normal shampoo.  3. After you shampoo, rinse your hair and body thoroughly to remove the shampoo.  4. Use CHG as you would any other liquid soap. You can apply CHG directly to the skin and wash gently with a scrungie or a clean washcloth.   5. Apply the CHG Soap to your body ONLY FROM THE NECK DOWN.  Do not use on open wounds or open sores. Avoid contact with your eyes, ears, mouth and genitals (private parts). Wash Face and genitals (private parts)  with your normal soap.  6. Wash thoroughly, paying special attention to the area where your surgery will be performed.  7. Thoroughly rinse your body with warm water from the neck down.  8. DO NOT shower/wash with your normal soap after using and rinsing off the CHG Soap.  9. Pat yourself dry with a CLEAN TOWEL.  10. Wear CLEAN PAJAMAS to bed the night before surgery, wear comfortable clothes the morning  of surgery  11. Place CLEAN SHEETS on your bed the night of your first shower and DO NOT SLEEP WITH PETS.    Day of Surgery:  Do not apply any deodorants/lotions.  Please wear clean clothes to the hospital/surgery center.   Remember to brush your teeth WITH YOUR REGULAR TOOTHPASTE.    Please read over the following fact sheets that you were given. Coughing and Deep Breathing, MRSA Information and Surgical Site Infection Prevention

## 2018-11-10 MED ORDER — VANCOMYCIN HCL 10 G IV SOLR
1500.0000 mg | INTRAVENOUS | Status: AC
  Administered 2018-11-11: 1500 mg via INTRAVENOUS
  Filled 2018-11-10: qty 1500

## 2018-11-10 NOTE — Anesthesia Preprocedure Evaluation (Addendum)
Anesthesia Evaluation  Patient identified by MRN, date of birth, ID band Patient awake    Reviewed: Allergy & Precautions, NPO status , Patient's Chart, lab work & pertinent test results, reviewed documented beta blocker date and time   Airway Mallampati: IV  TM Distance: >3 FB Neck ROM: Full    Dental  (+) Chipped,    Pulmonary sleep apnea ,    Pulmonary exam normal breath sounds clear to auscultation       Cardiovascular hypertension, Pt. on home beta blockers Normal cardiovascular exam Rhythm:Regular Rate:Normal  ECG: NSR, rate 92   Neuro/Psych negative neurological ROS  negative psych ROS   GI/Hepatic negative GI ROS, Neg liver ROS,   Endo/Other  negative endocrine ROS  Renal/GU negative Renal ROS     Musculoskeletal negative musculoskeletal ROS (+)   Abdominal (+) + obese,   Peds  Hematology HLD   Anesthesia Other Findings Spinal stenosis, Lumbar region with neurogenic claudication  Reproductive/Obstetrics                            Anesthesia Physical Anesthesia Plan  ASA: III  Anesthesia Plan: General   Post-op Pain Management:    Induction: Intravenous  PONV Risk Score and Plan: 3 and Midazolam, Dexamethasone, Ondansetron and Treatment may vary due to age or medical condition  Airway Management Planned: Oral ETT  Additional Equipment: Arterial line  Intra-op Plan:   Post-operative Plan: Extubation in OR  Informed Consent: I have reviewed the patients History and Physical, chart, labs and discussed the procedure including the risks, benefits and alternatives for the proposed anesthesia with the patient or authorized representative who has indicated his/her understanding and acceptance.   Dental advisory given  Plan Discussed with: CRNA  Anesthesia Plan Comments:        Anesthesia Quick Evaluation

## 2018-11-11 ENCOUNTER — Inpatient Hospital Stay (HOSPITAL_COMMUNITY): Payer: BC Managed Care – PPO | Admitting: Anesthesiology

## 2018-11-11 ENCOUNTER — Inpatient Hospital Stay (HOSPITAL_COMMUNITY): Payer: BC Managed Care – PPO | Admitting: Physician Assistant

## 2018-11-11 ENCOUNTER — Inpatient Hospital Stay (HOSPITAL_COMMUNITY)
Admission: RE | Disposition: A | Discharge status: Home / Self Care | Payer: Self-pay | Source: Home / Self Care | Attending: Neurological Surgery

## 2018-11-11 ENCOUNTER — Inpatient Hospital Stay (HOSPITAL_COMMUNITY): Payer: BC Managed Care – PPO

## 2018-11-11 ENCOUNTER — Inpatient Hospital Stay (HOSPITAL_COMMUNITY)
Admission: RE | Admit: 2018-11-11 | Discharge: 2018-11-16 | DRG: 455 | Disposition: A | Discharge status: Home / Self Care | Payer: BC Managed Care – PPO | Attending: Neurological Surgery | Admitting: Neurological Surgery

## 2018-11-11 ENCOUNTER — Other Ambulatory Visit: Payer: Self-pay

## 2018-11-11 ENCOUNTER — Encounter (HOSPITAL_COMMUNITY): Payer: Self-pay

## 2018-11-11 DIAGNOSIS — M48062 Spinal stenosis, lumbar region with neurogenic claudication: Secondary | ICD-10-CM | POA: Diagnosis present

## 2018-11-11 DIAGNOSIS — G4730 Sleep apnea, unspecified: Secondary | ICD-10-CM | POA: Diagnosis present

## 2018-11-11 DIAGNOSIS — E785 Hyperlipidemia, unspecified: Secondary | ICD-10-CM | POA: Diagnosis present

## 2018-11-11 DIAGNOSIS — I10 Essential (primary) hypertension: Secondary | ICD-10-CM | POA: Diagnosis present

## 2018-11-11 DIAGNOSIS — M5116 Intervertebral disc disorders with radiculopathy, lumbar region: Principal | ICD-10-CM | POA: Diagnosis present

## 2018-11-11 DIAGNOSIS — K5909 Other constipation: Secondary | ICD-10-CM | POA: Diagnosis present

## 2018-11-11 DIAGNOSIS — M7138 Other bursal cyst, other site: Secondary | ICD-10-CM | POA: Diagnosis present

## 2018-11-11 DIAGNOSIS — Z419 Encounter for procedure for purposes other than remedying health state, unspecified: Secondary | ICD-10-CM

## 2018-11-11 DIAGNOSIS — Z8249 Family history of ischemic heart disease and other diseases of the circulatory system: Secondary | ICD-10-CM | POA: Diagnosis not present

## 2018-11-11 DIAGNOSIS — Z09 Encounter for follow-up examination after completed treatment for conditions other than malignant neoplasm: Secondary | ICD-10-CM

## 2018-11-11 HISTORY — PX: LUMBAR PERCUTANEOUS PEDICLE SCREW 3 LEVEL: SHX5562

## 2018-11-11 HISTORY — PX: ANTERIOR LAT LUMBAR FUSION: SHX1168

## 2018-11-11 HISTORY — PX: APPLICATION OF ROBOTIC ASSISTANCE FOR SPINAL PROCEDURE: SHX6753

## 2018-11-11 SURGERY — ANTERIOR LATERAL LUMBAR FUSION 3 LEVELS
Anesthesia: General

## 2018-11-11 MED ORDER — PHENYLEPHRINE HCL 10 MG/ML IJ SOLN
INTRAMUSCULAR | Status: DC | PRN
  Administered 2018-11-11 (×3): 80 ug via INTRAVENOUS

## 2018-11-11 MED ORDER — HYDROMORPHONE HCL 1 MG/ML IJ SOLN
INTRAMUSCULAR | Status: AC
  Filled 2018-11-11: qty 1

## 2018-11-11 MED ORDER — DOCUSATE SODIUM 100 MG PO CAPS
100.0000 mg | ORAL_CAPSULE | Freq: Two times a day (BID) | ORAL | Status: DC
  Administered 2018-11-11 – 2018-11-16 (×10): 100 mg via ORAL
  Filled 2018-11-11 (×12): qty 1

## 2018-11-11 MED ORDER — SENNA 8.6 MG PO TABS
1.0000 | ORAL_TABLET | Freq: Two times a day (BID) | ORAL | Status: DC
  Administered 2018-11-11 – 2018-11-16 (×10): 8.6 mg via ORAL
  Filled 2018-11-11 (×10): qty 1

## 2018-11-11 MED ORDER — SODIUM CHLORIDE 0.9% FLUSH: 3.0000 mL | INTRAVENOUS | Status: DC | PRN

## 2018-11-11 MED ORDER — MIDAZOLAM HCL 2 MG/2ML IJ SOLN
INTRAMUSCULAR | Status: AC
  Filled 2018-11-11: qty 2

## 2018-11-11 MED ORDER — HYDROMORPHONE HCL 1 MG/ML IJ SOLN
0.2500 mg | INTRAMUSCULAR | Status: DC | PRN
  Administered 2018-11-11 (×4): 0.5 mg via INTRAVENOUS

## 2018-11-11 MED ORDER — METOPROLOL TARTRATE 5 MG/5ML IV SOLN
INTRAVENOUS | Status: AC
  Filled 2018-11-11: qty 5

## 2018-11-11 MED ORDER — DOXAZOSIN MESYLATE 2 MG PO TABS
2.0000 mg | ORAL_TABLET | Freq: Every day | ORAL | Status: DC
  Administered 2018-11-12 – 2018-11-15 (×4): 2 mg via ORAL
  Filled 2018-11-11 (×6): qty 1

## 2018-11-11 MED ORDER — PROMETHAZINE HCL 25 MG/ML IJ SOLN: 6.2500 mg | INTRAMUSCULAR | Status: DC | PRN

## 2018-11-11 MED ORDER — CARISOPRODOL 350 MG PO TABS
350.0000 mg | ORAL_TABLET | Freq: Three times a day (TID) | ORAL | Status: DC | PRN
  Administered 2018-11-11 – 2018-11-15 (×8): 350 mg via ORAL
  Filled 2018-11-11 (×9): qty 1

## 2018-11-11 MED ORDER — FENTANYL CITRATE (PF) 100 MCG/2ML IJ SOLN
INTRAMUSCULAR | Status: DC | PRN
  Administered 2018-11-11: 50 ug via INTRAVENOUS
  Administered 2018-11-11: 100 ug via INTRAVENOUS
  Administered 2018-11-11 (×2): 50 ug via INTRAVENOUS
  Administered 2018-11-11: 100 ug via INTRAVENOUS
  Administered 2018-11-11: 50 ug via INTRAVENOUS
  Administered 2018-11-11 (×2): 100 ug via INTRAVENOUS
  Administered 2018-11-11: 50 ug via INTRAVENOUS
  Administered 2018-11-11: 100 ug via INTRAVENOUS

## 2018-11-11 MED ORDER — THROMBIN 5000 UNITS EX SOLR
OROMUCOSAL | Status: DC | PRN
  Administered 2018-11-11 (×2): via TOPICAL

## 2018-11-11 MED ORDER — OXYCODONE-ACETAMINOPHEN 5-325 MG PO TABS
1.0000 | ORAL_TABLET | ORAL | Status: DC | PRN
  Administered 2018-11-11: 2 via ORAL
  Administered 2018-11-11: 1 via ORAL
  Filled 2018-11-11: qty 1

## 2018-11-11 MED ORDER — METHOCARBAMOL 1000 MG/10ML IJ SOLN
500.0000 mg | Freq: Four times a day (QID) | INTRAVENOUS | Status: DC | PRN
  Filled 2018-11-11: qty 5

## 2018-11-11 MED ORDER — ONDANSETRON HCL 4 MG/2ML IJ SOLN
INTRAMUSCULAR | Status: DC | PRN
  Administered 2018-11-11: 4 mg via INTRAVENOUS

## 2018-11-11 MED ORDER — MORPHINE SULFATE (PF) 2 MG/ML IV SOLN
INTRAVENOUS | Status: AC
  Filled 2018-11-11: qty 1

## 2018-11-11 MED ORDER — OXYCODONE HCL 5 MG PO TABS
ORAL_TABLET | ORAL | Status: AC
  Filled 2018-11-11: qty 1

## 2018-11-11 MED ORDER — CEFAZOLIN SODIUM-DEXTROSE 2-4 GM/100ML-% IV SOLN
2.0000 g | INTRAVENOUS | Status: AC
  Administered 2018-11-11: 2 g via INTRAVENOUS
  Filled 2018-11-11: qty 100

## 2018-11-11 MED ORDER — CALCIUM POLYCARBOPHIL 625 MG PO TABS
1250.0000 mg | ORAL_TABLET | Freq: Every day | ORAL | Status: DC
  Administered 2018-11-12 – 2018-11-16 (×5): 1250 mg via ORAL
  Filled 2018-11-11 (×5): qty 2

## 2018-11-11 MED ORDER — PRAVASTATIN SODIUM 40 MG PO TABS
40.0000 mg | ORAL_TABLET | Freq: Every day | ORAL | Status: DC
  Administered 2018-11-11 – 2018-11-15 (×5): 40 mg via ORAL
  Filled 2018-11-11 (×5): qty 1

## 2018-11-11 MED ORDER — FENTANYL CITRATE (PF) 250 MCG/5ML IJ SOLN
INTRAMUSCULAR | Status: AC
  Filled 2018-11-11: qty 10

## 2018-11-11 MED ORDER — KETOROLAC TROMETHAMINE 15 MG/ML IJ SOLN
15.0000 mg | Freq: Once | INTRAMUSCULAR | Status: AC
  Administered 2018-11-11: 15 mg via INTRAVENOUS

## 2018-11-11 MED ORDER — FENTANYL CITRATE (PF) 250 MCG/5ML IJ SOLN
INTRAMUSCULAR | Status: AC
  Filled 2018-11-11: qty 5

## 2018-11-11 MED ORDER — OXYCODONE HCL 5 MG PO TABS
5.0000 mg | ORAL_TABLET | Freq: Once | ORAL | Status: AC | PRN
  Administered 2018-11-11: 5 mg via ORAL

## 2018-11-11 MED ORDER — METOPROLOL TARTRATE 5 MG/5ML IV SOLN
2.5000 mg | INTRAVENOUS | Status: AC | PRN
  Administered 2018-11-11 (×2): 2.5 mg via INTRAVENOUS

## 2018-11-11 MED ORDER — BISACODYL 10 MG RE SUPP
10.0000 mg | Freq: Every day | RECTAL | Status: DC | PRN
  Administered 2018-11-15: 10 mg via RECTAL
  Filled 2018-11-11: qty 1

## 2018-11-11 MED ORDER — LACTATED RINGERS IV SOLN
INTRAVENOUS | Status: DC
  Administered 2018-11-11 – 2018-11-12 (×2): via INTRAVENOUS

## 2018-11-11 MED ORDER — RISAQUAD PO CAPS
1.0000 | ORAL_CAPSULE | Freq: Every day | ORAL | Status: DC
  Administered 2018-11-12 – 2018-11-16 (×5): 1 via ORAL
  Filled 2018-11-11 (×5): qty 1

## 2018-11-11 MED ORDER — ACETAMINOPHEN 325 MG PO TABS: 650.0000 mg | ORAL_TABLET | ORAL | Status: DC | PRN

## 2018-11-11 MED ORDER — SODIUM CHLORIDE 0.9% FLUSH
3.0000 mL | Freq: Two times a day (BID) | INTRAVENOUS | Status: DC
  Administered 2018-11-12 – 2018-11-15 (×9): 3 mL via INTRAVENOUS

## 2018-11-11 MED ORDER — FIBER PO CAPS: 2.0000 | ORAL_CAPSULE | Freq: Two times a day (BID) | ORAL | Status: DC

## 2018-11-11 MED ORDER — OXYCODONE HCL 5 MG/5ML PO SOLN: 5.0000 mg | Freq: Once | ORAL | Status: AC | PRN

## 2018-11-11 MED ORDER — CHLORHEXIDINE GLUCONATE CLOTH 2 % EX PADS: 6.0000 | MEDICATED_PAD | Freq: Once | CUTANEOUS | Status: DC

## 2018-11-11 MED ORDER — SUCCINYLCHOLINE CHLORIDE 20 MG/ML IJ SOLN
INTRAMUSCULAR | Status: DC | PRN
  Administered 2018-11-11: 100 mg via INTRAVENOUS

## 2018-11-11 MED ORDER — KETOROLAC TROMETHAMINE 15 MG/ML IJ SOLN
INTRAMUSCULAR | Status: AC
  Administered 2018-11-12: 15 mg
  Filled 2018-11-11: qty 1

## 2018-11-11 MED ORDER — LIDOCAINE HCL (CARDIAC) PF 100 MG/5ML IV SOSY
PREFILLED_SYRINGE | INTRAVENOUS | Status: DC | PRN
  Administered 2018-11-11: 60 mg via INTRAVENOUS

## 2018-11-11 MED ORDER — SODIUM CHLORIDE 0.9 % IV SOLN
INTRAVENOUS | Status: DC | PRN
  Administered 2018-11-11: 09:00:00

## 2018-11-11 MED ORDER — PROPOFOL 500 MG/50ML IV EMUL
INTRAVENOUS | Status: AC
  Filled 2018-11-11: qty 100

## 2018-11-11 MED ORDER — BUPIVACAINE HCL (PF) 0.5 % IJ SOLN
INTRAMUSCULAR | Status: AC
  Filled 2018-11-11: qty 30

## 2018-11-11 MED ORDER — ATENOLOL 25 MG PO TABS
25.0000 mg | ORAL_TABLET | Freq: Every day | ORAL | Status: DC
  Administered 2018-11-11 – 2018-11-15 (×5): 25 mg via ORAL
  Filled 2018-11-11 (×5): qty 1

## 2018-11-11 MED ORDER — ONDANSETRON HCL 4 MG PO TABS: 4.0000 mg | ORAL_TABLET | Freq: Four times a day (QID) | ORAL | Status: DC | PRN

## 2018-11-11 MED ORDER — PNEUMOCOCCAL VAC POLYVALENT 25 MCG/0.5ML IJ INJ
0.5000 mL | INJECTION | INTRAMUSCULAR | Status: DC
  Filled 2018-11-11 (×2): qty 0.5

## 2018-11-11 MED ORDER — 0.9 % SODIUM CHLORIDE (POUR BTL) OPTIME
TOPICAL | Status: DC | PRN
  Administered 2018-11-11: 1000 mL

## 2018-11-11 MED ORDER — LIDOCAINE-EPINEPHRINE 1 %-1:100000 IJ SOLN
INTRAMUSCULAR | Status: DC | PRN
  Administered 2018-11-11: 18 mL
  Administered 2018-11-11: 5 mL

## 2018-11-11 MED ORDER — METHOCARBAMOL 500 MG PO TABS
500.0000 mg | ORAL_TABLET | Freq: Four times a day (QID) | ORAL | Status: DC | PRN
  Administered 2018-11-11 – 2018-11-16 (×10): 500 mg via ORAL
  Filled 2018-11-11 (×9): qty 1

## 2018-11-11 MED ORDER — OXYCODONE-ACETAMINOPHEN 5-325 MG PO TABS
ORAL_TABLET | ORAL | Status: AC
  Filled 2018-11-11: qty 2

## 2018-11-11 MED ORDER — LIDOCAINE-EPINEPHRINE 1 %-1:100000 IJ SOLN
INTRAMUSCULAR | Status: AC
  Filled 2018-11-11: qty 1

## 2018-11-11 MED ORDER — LACTATED RINGERS IV SOLN
INTRAVENOUS | Status: DC
  Administered 2018-11-11 (×4): via INTRAVENOUS

## 2018-11-11 MED ORDER — PROPOFOL 500 MG/50ML IV EMUL
INTRAVENOUS | Status: DC | PRN
  Administered 2018-11-11 (×2): 50 ug/kg/min via INTRAVENOUS

## 2018-11-11 MED ORDER — THROMBIN 5000 UNITS EX SOLR
CUTANEOUS | Status: AC
  Filled 2018-11-11: qty 10000

## 2018-11-11 MED ORDER — ALUM & MAG HYDROXIDE-SIMETH 200-200-20 MG/5ML PO SUSP: 30.0000 mL | Freq: Four times a day (QID) | ORAL | Status: DC | PRN

## 2018-11-11 MED ORDER — PROPOFOL 10 MG/ML IV BOLUS
INTRAVENOUS | Status: AC
  Filled 2018-11-11: qty 20

## 2018-11-11 MED ORDER — METHOCARBAMOL 500 MG PO TABS
ORAL_TABLET | ORAL | Status: AC
  Filled 2018-11-11: qty 1

## 2018-11-11 MED ORDER — ACETAMINOPHEN 650 MG RE SUPP: 650.0000 mg | RECTAL | Status: DC | PRN

## 2018-11-11 MED ORDER — MIDAZOLAM HCL 5 MG/5ML IJ SOLN
INTRAMUSCULAR | Status: DC | PRN
  Administered 2018-11-11: 2 mg via INTRAVENOUS

## 2018-11-11 MED ORDER — SALINE SPRAY 0.65 % NA SOLN
1.0000 | Freq: Three times a day (TID) | NASAL | Status: DC
  Administered 2018-11-12 – 2018-11-16 (×13): 1 via NASAL
  Filled 2018-11-11: qty 44

## 2018-11-11 MED ORDER — KETOROLAC TROMETHAMINE 15 MG/ML IJ SOLN
15.0000 mg | Freq: Four times a day (QID) | INTRAMUSCULAR | Status: AC
  Administered 2018-11-12 (×3): 15 mg via INTRAVENOUS
  Filled 2018-11-11 (×4): qty 1

## 2018-11-11 MED ORDER — MENTHOL 3 MG MT LOZG: 1.0000 | LOZENGE | OROMUCOSAL | Status: DC | PRN

## 2018-11-11 MED ORDER — BUPIVACAINE HCL (PF) 0.5 % IJ SOLN
INTRAMUSCULAR | Status: DC | PRN
  Administered 2018-11-11: 18 mL
  Administered 2018-11-11: 5 mL

## 2018-11-11 MED ORDER — FLEET ENEMA 7-19 GM/118ML RE ENEM: 1.0000 | ENEMA | Freq: Once | RECTAL | Status: DC | PRN

## 2018-11-11 MED ORDER — SODIUM CHLORIDE 0.9 % IV SOLN: 250.0000 mL | INTRAVENOUS | Status: DC

## 2018-11-11 MED ORDER — PHENOL 1.4 % MT LIQD: 1.0000 | OROMUCOSAL | Status: DC | PRN

## 2018-11-11 MED ORDER — POLYETHYLENE GLYCOL 3350 17 G PO PACK
17.0000 g | PACK | Freq: Every day | ORAL | Status: DC | PRN
  Administered 2018-11-15: 17 g via ORAL
  Filled 2018-11-11 (×2): qty 1

## 2018-11-11 MED ORDER — ONDANSETRON HCL 4 MG/2ML IJ SOLN: 4.0000 mg | Freq: Four times a day (QID) | INTRAMUSCULAR | Status: DC | PRN

## 2018-11-11 MED ORDER — EPHEDRINE SULFATE 50 MG/ML IJ SOLN
INTRAMUSCULAR | Status: DC | PRN
  Administered 2018-11-11 (×2): 10 mg via INTRAVENOUS

## 2018-11-11 MED ORDER — DEXAMETHASONE SODIUM PHOSPHATE 10 MG/ML IJ SOLN
INTRAMUSCULAR | Status: DC | PRN
  Administered 2018-11-11: 10 mg via INTRAVENOUS

## 2018-11-11 MED ORDER — CARISOPRODOL 350 MG PO TABS: 350.0000 mg | ORAL_TABLET | Freq: Three times a day (TID) | ORAL | Status: DC

## 2018-11-11 MED ORDER — DOCUSATE SODIUM 100 MG PO CAPS: 100.0000 mg | ORAL_CAPSULE | Freq: Every day | ORAL | Status: DC

## 2018-11-11 MED ORDER — MORPHINE SULFATE (PF) 2 MG/ML IV SOLN
2.0000 mg | INTRAVENOUS | Status: DC | PRN
  Administered 2018-11-11 (×2): 2 mg via INTRAVENOUS
  Filled 2018-11-11: qty 1

## 2018-11-11 MED ORDER — PROPOFOL 10 MG/ML IV BOLUS
INTRAVENOUS | Status: DC | PRN
  Administered 2018-11-11: 150 mg via INTRAVENOUS

## 2018-11-11 MED ORDER — CARISOPRODOL 350 MG PO TABS
350.0000 mg | ORAL_TABLET | Freq: Four times a day (QID) | ORAL | Status: DC | PRN
  Administered 2018-11-11: 350 mg via ORAL
  Filled 2018-11-11 (×2): qty 1

## 2018-11-11 SURGICAL SUPPLY — 76 items
ADH SKN CLS APL DERMABOND .7 (GAUZE/BANDAGES/DRESSINGS) ×2
BAG DECANTER FOR FLEXI CONT (MISCELLANEOUS) ×2 IMPLANT
BAG URIMETER BARDEX IC 350 (UROLOGICAL SUPPLIES) ×1 IMPLANT
BIT DRILL LONG 3.0X30 (BIT) ×1 IMPLANT
BIT DRILL LONG 3X80 (BIT) IMPLANT
BIT DRILL LONG 4X80 (BIT) IMPLANT
BIT DRILL SHORT 3.0X30 (BIT) IMPLANT
BIT DRILL SHORT 3X80 (BIT) IMPLANT
BLADE CLIPPER SURG (BLADE) IMPLANT
BLADE SURG 11 STRL SS (BLADE) ×2 IMPLANT
BONE MATRIX OSTEOCEL PRO MED (Bone Implant) ×3 IMPLANT
CAGE COROENT XL WIDE 14X22X60M (Cage) ×1 IMPLANT
CAGE COROENT XL WIDE14X22X55 (Cage) ×2 IMPLANT
CARTRIDGE OIL MAESTRO DRILL (MISCELLANEOUS) ×1 IMPLANT
CLIP NEUROVISION LG (CLIP) ×1 IMPLANT
CONT SPEC 4OZ CLIKSEAL STRL BL (MISCELLANEOUS) ×2 IMPLANT
COVER BACK TABLE 24X17X13 BIG (DRAPES) IMPLANT
COVER BACK TABLE 60X90IN (DRAPES) ×2 IMPLANT
COVER WAND RF STERILE (DRAPES) ×3 IMPLANT
DERMABOND ADVANCED (GAUZE/BANDAGES/DRESSINGS) ×2
DERMABOND ADVANCED .7 DNX12 (GAUZE/BANDAGES/DRESSINGS) ×3 IMPLANT
DIFFUSER DRILL AIR PNEUMATIC (MISCELLANEOUS) ×1 IMPLANT
DRAPE C-ARM 42X72 X-RAY (DRAPES) ×5 IMPLANT
DRAPE C-ARMOR (DRAPES) ×4 IMPLANT
DRAPE LAPAROTOMY 100X72X124 (DRAPES) ×4 IMPLANT
DRAPE POUCH INSTRU U-SHP 10X18 (DRAPES) ×2 IMPLANT
DRAPE SHEET LG 3/4 BI-LAMINATE (DRAPES) ×2 IMPLANT
DURAPREP 26ML APPLICATOR (WOUND CARE) ×4 IMPLANT
ELECT BLADE 4.0 EZ CLEAN MEGAD (MISCELLANEOUS) ×2
ELECT REM PT RETURN 9FT ADLT (ELECTROSURGICAL) ×4
ELECTRODE BLDE 4.0 EZ CLN MEGD (MISCELLANEOUS) IMPLANT
ELECTRODE REM PT RTRN 9FT ADLT (ELECTROSURGICAL) ×2 IMPLANT
GAUZE 4X4 16PLY RFD (DISPOSABLE) IMPLANT
GLOVE BIOGEL PI IND STRL 8.5 (GLOVE) ×2 IMPLANT
GLOVE BIOGEL PI INDICATOR 8.5 (GLOVE) ×2
GLOVE ECLIPSE 8.5 STRL (GLOVE) ×4 IMPLANT
GLOVE EXAM NITRILE XL STR (GLOVE) IMPLANT
GOWN STRL REUS W/ TWL LRG LVL3 (GOWN DISPOSABLE) IMPLANT
GOWN STRL REUS W/ TWL XL LVL3 (GOWN DISPOSABLE) ×3 IMPLANT
GOWN STRL REUS W/TWL 2XL LVL3 (GOWN DISPOSABLE) ×6 IMPLANT
GOWN STRL REUS W/TWL LRG LVL3 (GOWN DISPOSABLE)
GOWN STRL REUS W/TWL XL LVL3 (GOWN DISPOSABLE) ×6
GUIDEWIRE NITINOL BEVEL TIP (WIRE) ×8 IMPLANT
HEMOSTAT POWDER SURGIFOAM 1G (HEMOSTASIS) ×2 IMPLANT
KIT BASIN OR (CUSTOM PROCEDURE TRAY) ×4 IMPLANT
KIT DILATOR XLIF 5 (KITS) ×1 IMPLANT
KIT SPINE MAZOR X ROBO DISP (MISCELLANEOUS) ×2 IMPLANT
KIT SURGICAL ACCESS MAXCESS 4 (KITS) ×1 IMPLANT
KIT TURNOVER KIT B (KITS) ×2 IMPLANT
MARKER SKIN DUAL TIP RULER LAB (MISCELLANEOUS) ×2 IMPLANT
MODULE NVM5 NEXT GEN EMG (NEEDLE) ×1 IMPLANT
NDL HYPO 25X1 1.5 SAFETY (NEEDLE) ×2 IMPLANT
NEEDLE HYPO 25X1 1.5 SAFETY (NEEDLE) ×4 IMPLANT
NS IRRIG 1000ML POUR BTL (IV SOLUTION) ×3 IMPLANT
OIL CARTRIDGE MAESTRO DRILL (MISCELLANEOUS)
PACK LAMINECTOMY NEURO (CUSTOM PROCEDURE TRAY) ×4 IMPLANT
PAD ARMBOARD 7.5X6 YLW CONV (MISCELLANEOUS) ×6 IMPLANT
PIN HEAD 2.5X60MM (PIN) IMPLANT
ROD PRECEPT TI 130MM LORD PB (Rod) ×1 IMPLANT
ROD RELINE LORDOTIC 5.5X140 (Rod) ×1 IMPLANT
SCREW LOCK RELINE 5.5 TULIP (Screw) ×8 IMPLANT
SCREW MAS RELINE 6.5X45 POLY (Screw) ×2 IMPLANT
SCREW MAS RELINE 6.5X50 POLY (Screw) ×6 IMPLANT
SCREW SCHANZ SA 4.0MM (MISCELLANEOUS) ×1 IMPLANT
SPONGE LAP 4X18 RFD (DISPOSABLE) IMPLANT
SPONGE SURGIFOAM ABS GEL SZ50 (HEMOSTASIS) IMPLANT
SUT VIC AB 1 CT1 18XBRD ANBCTR (SUTURE) ×1 IMPLANT
SUT VIC AB 1 CT1 8-18 (SUTURE) ×6
SUT VIC AB 2-0 CP2 18 (SUTURE) ×7 IMPLANT
SUT VIC AB 3-0 SH 8-18 (SUTURE) ×7 IMPLANT
SYR CONTROL 10ML LL (SYRINGE) ×1 IMPLANT
TOWEL GREEN STERILE (TOWEL DISPOSABLE) ×3 IMPLANT
TOWEL GREEN STERILE FF (TOWEL DISPOSABLE) ×4 IMPLANT
TRAY FOLEY MTR SLVR 16FR STAT (SET/KITS/TRAYS/PACK) ×3 IMPLANT
TUBE MAZOR SA REDUCTION (TUBING) ×2 IMPLANT
WATER STERILE IRR 1000ML POUR (IV SOLUTION) ×3 IMPLANT

## 2018-11-11 NOTE — H&P (Signed)
Douglas Roberts. is an 62 y.o. male.   Chief Complaint: Back and bilateral leg pain and weakness HPI: Douglas Roberts is a 62 year old individual whom I seen in the past he had spondylitic stenosis and underwent a multilevel lumbar laminectomy and decompression at L3 4-3 a few years back he has developed increasing pain and weakness in his lower extremities despite a brief respite from his symptoms after his surgery further evaluation with a recent MRI demonstrates that the patient has developed significant spondylitic stenosis at L2 334 and now also 4 5 with evidence of some synovial cyst formation after careful consideration of his options we discussed surgical stabilization of the lumbar spine with an indirect decompression using an anterolateral technique to remove the disc increase the disc space height to thus open the foramen and open the central canal followed by percutaneous posterior fixation using robotically assisted pedicle screw placement from L2-L5.  He is now admitted for that procedure.  Past Medical History:  Diagnosis Date  . Hyperlipemia   . Hypertension   . Sleep apnea    no CPAP    Past Surgical History:  Procedure Laterality Date  . ADENOIDECTOMY    . COLONOSCOPY     x2  . LUMBAR LAMINECTOMY/DECOMPRESSION MICRODISCECTOMY Bilateral 10/15/2017   Procedure: Bilateral Lumbar Two- Three Lumbar Three- Four Lumbar Four- Five Laminectomy/Foraminotomy;  Surgeon: Barnett Abu, MD;  Location: [facility] OR;  Service: Neurosurgery;  Laterality: Bilateral;  Bilateral L2-3 L3-4 L4-5 Laminectomy/Foraminotomy    Family History  Problem Relation Age of Onset  . Hypertension Mother   . Dementia Mother    Social History:  reports that he has never smoked. He has never used smokeless tobacco. He reports that he drinks alcohol. He reports that he does not use drugs.  Allergies:  Allergies  Allergen Reactions  . Bee Venom Anaphylaxis    Medications Prior to Admission  Medication Sig  Dispense Refill  . atenolol (TENORMIN) 25 MG tablet Take 25 mg by mouth daily at 6 PM.     . docusate sodium (COLACE) 100 MG capsule Take 100 mg by mouth daily at 6 (six) AM.    . doxazosin (CARDURA) 2 MG tablet Take 2 mg by mouth daily at 6 PM.    . Fiber CAPS Take 2 capsules by mouth 2 (two) times daily.     . fish oil-omega-3 fatty acids 1000 MG capsule Take 2 g by mouth daily at 6 PM.     . ibuprofen (ADVIL,MOTRIN) 200 MG tablet Take 400 mg by mouth 2 (two) times daily as needed (for pain.).     Marland Kitchen pravastatin (PRAVACHOL) 40 MG tablet Take 40 mg by mouth daily at 6 PM.     . Probiotic Product (PROBIOTIC PO) Take 1 capsule by mouth daily at 6 (six) AM.    . Saw Palmetto 450 MG CAPS Take 450 mg by mouth daily at 6 PM.    . sodium chloride (OCEAN) 0.65 % SOLN nasal spray Place 1 spray into both nostrils 3 (three) times daily.    . celecoxib (CELEBREX) 200 MG capsule Take 1 capsule (200 mg total) 2 (two) times daily by mouth. (Patient not taking: Reported on 10/29/2018) 60 capsule 3  . gabapentin (NEURONTIN) 300 MG capsule Take 1 capsule (300 mg total) 2 (two) times daily by mouth. (Patient not taking: Reported on 10/29/2018) 60 capsule 3  . Menthol, Topical Analgesic, (ICY HOT EX) Apply 1 application topically daily as needed (pain).  No results found for this or any previous visit (from the past 48 hour(s)). No results found.  Review of Systems  Constitutional: Negative.   HENT: Negative.   Eyes: Negative.   Respiratory: Negative.   Cardiovascular: Negative.   Gastrointestinal: Negative.   Genitourinary: Negative.   Musculoskeletal: Positive for back pain.  Skin: Negative.   Neurological:       Leg pain and weakness  Endo/Heme/Allergies: Negative.   Psychiatric/Behavioral: Negative.     Blood pressure 119/78, pulse 62, temperature 97.7 F (36.5 C), temperature source Oral, resp. rate 18, height 6\' 2"  (1.88 m), weight 109.5 kg, SpO2 97 %. Physical Exam  Constitutional: He is  oriented to person, place, and time. He appears well-developed and well-nourished.  HENT:  Head: Normocephalic and atraumatic.  Eyes: Pupils are equal, round, and reactive to light. EOM are normal.  Neck: Normal range of motion. Neck supple.  Cardiovascular: Normal rate and regular rhythm.  Respiratory: Effort normal and breath sounds normal.  GI: Soft. Bowel sounds are normal.  Musculoskeletal:  No tenderness palpation in the lower lumbar spine straight leg raising is positive at 30 degrees bilaterally Patrick's maneuver is negative bilaterally.  Neurological: He is alert and oriented to person, place, and time.  Tone and bulk appear intact in the iliopsoas and quadriceps tibialis anterior and gastrocs repeated walking onto the heels causes weakness in the tibialis anterior bilaterally at 4 out of 5  Skin: Skin is warm and dry.  Psychiatric: He has a normal mood and affect. His behavior is normal. Judgment and thought content normal.     Assessment/Plan 1 listhesis and stenosis L2-3 L3-4 L4-5.  Plan: Anterolateral decompression L2-3 L3-4 L4-5 posterior pedicle nuclear fixation percutaneously with robotic assistance L2-L5.  Neural monitoring.  Stefani DamaHenry J Hammad Finkler, MD 11/11/2018, 7:51 AM

## 2018-11-11 NOTE — Anesthesia Procedure Notes (Signed)
Procedure Name: Intubation Date/Time: 11/11/2018 7:48 AM Performed by: Murl Zogg T, CRNA Pre-anesthesia Checklist: Patient identified, Emergency Drugs available, Suction available and Patient being monitored Patient Re-evaluated:Patient Re-evaluated prior to induction Oxygen Delivery Method: Circle system utilized Preoxygenation: Pre-oxygenation with 100% oxygen Induction Type: IV induction Ventilation: Mask ventilation without difficulty and Oral airway inserted - appropriate to patient size Laryngoscope Size: Miller and 3 Grade View: Grade I Tube type: Oral Tube size: 7.5 mm Number of attempts: 1 Airway Equipment and Method: Patient positioned with wedge pillow and Stylet Placement Confirmation: ETT inserted through vocal cords under direct vision,  positive ETCO2 and breath sounds checked- equal and bilateral Secured at: 23 cm Tube secured with: Tape Dental Injury: Teeth and Oropharynx as per pre-operative assessment

## 2018-11-11 NOTE — Anesthesia Postprocedure Evaluation (Signed)
Anesthesia Post Note  Patient: Douglas DoomJoseph E Donate Jr.  Procedure(s) Performed: Lumbar Two-Three, Lumbar Three-Four, Lumbar Four-Five Anterolateral decompression/interbody fusion with posterior percutaneous arthrodesis Lumbar Two to Lumbar Five with Mazor (N/A ) Lumbar Two-Three, Lumbar Three-Four, Lumbar Four-Five Lumbar Percutaneous Pedicle Screw (N/A ) APPLICATION OF ROBOTIC ASSISTANCE FOR SPINAL PROCEDURE (N/A )     Patient location during evaluation: PACU Anesthesia Type: General Level of consciousness: awake and alert Pain management: pain level controlled Vital Signs Assessment: post-procedure vital signs reviewed and stable Respiratory status: spontaneous breathing, nonlabored ventilation, respiratory function stable and patient connected to nasal cannula oxygen Cardiovascular status: blood pressure returned to baseline and stable Postop Assessment: no apparent nausea or vomiting Anesthetic complications: no    Last Vitals:  Vitals:   11/11/18 1639 11/11/18 1642  BP:  (!) 155/93  Pulse: 95 98  Resp: (!) 9 11  Temp:    SpO2: 99% 98%    Last Pain:  Vitals:   11/11/18 1647  TempSrc:   PainSc: Asleep                  P 

## 2018-11-11 NOTE — Op Note (Signed)
Date of surgery: 11/11/2018 Operative diagnosis: Recurrent lumbar spinal stenosis L2-3 L3-4 L4-5 status post laminectomy for decompression 2 years ago Postoperative diagnosis: Same Procedure: Anterolateral decompression L2-3 L3-4 L4-5 with placement of Xlif spacer arthrodesis with allograft (ostiocell).  Posterior percutaneous pedicle fixation with robotically assisted screw placement, EMG neural monitoring throughout. 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  He was advised that surgical intervention should require stabilization procedure from L2-L5 and after some discussion of the various options of an open posterior operation versus an indirect decompression using an anterolateral technique we opted for the latter and he is now admitted for this procedure.  Procedure: Patient was brought to the operating room supine on the stretcher.  After the smooth induction of general endotracheal anesthesia and placement of EMG monitoring electrodes in the muscles of the lower extremities he was carefully turned into the right lateral decubitus position.  He was secured to the operating table after orthogonal radiographs were obtained to identify good positioning in the lateral projections.  Then the skin was marked for entry sites into the L4-5 the L3-4 and the L2-3 disc spaces on the lateral aspect.  Skin was then prepped with alcohol DuraPrep and draped in a sterile fashion.  An  initial incision was made over the area of L4-L5.  A posterior incision was created and by blunt dissection into the retroperitoneal space a finger could be admitted into the retroperitoneal space to allow guidance of a guidance cannula down to the level of L4-L5 which was placed over the interspace at L4-L5 through the psoas muscle.  Neural stimulation was performed to make sure that none the elements of the lumbar plexus were involved.  Once this was verified a series of dilators was passed and then over this 160 mm retractor was placed into this aperture.  Then by locating the interspace and making sure that no neural elements were entrapped with EMG monitoring a shim was placed into the disc space.  The retractor was locked to the operating table with a clamp.  Through this opening then the disc space was uncovered and opened with a #15 blade combination of curettes and rongeurs was then used to evacuate substantial quantities of severely degenerated desiccated disc material.  The endplates were carefully curettaged also.  A series of dilators was then passed initially starting an 8 mm size and then gradually dilating to the 10 x 22 mm size with 10 degrees of lordosis.  Ultimately a 14 x 22 mm spacer measuring 60 mm in width with 10 degrees of lordosis was chosen this expanded the interspace considerably.  This was placed into the interspace under direct fluoroscopic visualization all the time using neuro monitoring.  The spacer was filled with ostia cell allograft matrix.  Once this was placed attention was turned to L3-4 and L2-3 these were performed through the common lateral incision first dissecting inferiorly and dissecting superiorly same technique was used and the same size spacer was placed in each of the spaces L3-4 and L2-3.  At  L2-3 the width of the spacer was decreased to 55 mm.  Final radiographs were obtained in AP and lateral projection and the lateral incisions were then closed.  Next the  patient was placed prone onto the LamarJackson table and the robotic arm was placed onto the WannJackson table.  The back was prepped with alcohol DuraPrep and draped in a sterile fashion.  Then orthogonal radiographs were obtained again to register the Mazor robot to the referencing CT scans that we used for the surgical planning.  Surgical plan had been preapproved with 6.5 x 50 mm screws in L2-L3 and L4 and 6.5 x 45 mm screws placed in L5.  Then once the monitoring was completed entry sites were marked on the skin for the screws at L2-3-4 and 5 and the skin was infiltrated with a total of 25 cc of lidocaine with epinephrine mixed 50-50 with half percent Marcaine.  A small vertical incision was created over the each of the entry sites.  Then using the robotic arm K wires were passed into the vertebrae at L2-3-4 and 5 and then over the K wires a 6.5 x 50 mm screws were placed with neural monitoring at L2-L3 and L4 and 6.5 x 45 mm screws were placed in L5.  Rods were then passed percutaneously from L2-L5 with 130 mm rod being placed on the right side 126 mm rod being placed on the right side final radiographs identified good position of the screws the system was torqued down into a neutral construct.  AP and lateral radiographs confirmed good placement of the hardware and good overall alignment.  With this the robotic arm was removed the Steinmann pin placed in the iliac crest on the left side was removed and the individual incisions were closed with 2-0 Vicryl in the subcutaneous tissues and 3-0 Vicryl subcuticularly.  Blood loss for the procedure was estimated at 150 cc.  Patient tolerated procedure well is returned to recovery room stable condition.

## 2018-11-11 NOTE — Anesthesia Procedure Notes (Signed)
Arterial Line Insertion Start/End11/26/2019 7:05 AM, 11/11/2018 7:15 AM Performed by: Donzella Carrol T, Scientist, clinical (histocompatibility and immunogenetics)CRNA, CRNA  Patient location: Pre-op. Preanesthetic checklist: patient identified, IV checked, site marked, risks and benefits discussed, surgical consent, monitors and equipment checked and pre-op evaluation Lidocaine 1% used for infiltration Right, radial was placed Catheter size: 20 G Hand hygiene performed  and maximum sterile barriers used   Attempts: 1 Procedure performed without using ultrasound guided technique. Following insertion, dressing applied and Biopatch. Post procedure assessment: normal  Patient tolerated the procedure well with no immediate complications.

## 2018-11-11 NOTE — Progress Notes (Signed)
Dr Danielle DessElsner notified of patients request to have a medication PRN for pain other than Percocet. Stated he vocalized before surgery it was not effective for him. Requested Soma- Dr Danielle DessElsner gave verbal order to Soma QID prn. Will give ordered pain medication and Soma as ordered.

## 2018-11-11 NOTE — Transfer of Care (Signed)
Immediate Anesthesia Transfer of Care Note  Patient: Douglas DoomJoseph E Christenberry Jr.  Procedure(s) Performed: Lumbar Two-Three, Lumbar Three-Four, Lumbar Four-Five Anterolateral decompression/interbody fusion with posterior percutaneous arthrodesis Lumbar Two to Lumbar Five with Mazor (N/A ) Lumbar Two-Three, Lumbar Three-Four, Lumbar Four-Five Lumbar Percutaneous Pedicle Screw (N/A ) APPLICATION OF ROBOTIC ASSISTANCE FOR SPINAL PROCEDURE (N/A )  Patient Location: PACU  Anesthesia Type:General  Level of Consciousness: awake, alert  and oriented  Airway & Oxygen Therapy: Patient Spontanous Breathing and Patient connected to nasal cannula oxygen  Post-op Assessment: Report given to RN, Post -op Vital signs reviewed and stable and Patient moving all extremities  Post vital signs: Reviewed and stable  Last Vitals:  Vitals Value Taken Time  BP 124/78 11/11/2018  1:25 PM  Temp 36.4 C 11/11/2018  1:25 PM  Pulse 92 11/11/2018  1:29 PM  Resp 13 11/11/2018  1:29 PM  SpO2 94 % 11/11/2018  1:29 PM  Vitals shown include unvalidated device data.  Last Pain:  Vitals:   11/11/18 1325  TempSrc:   PainSc: Asleep      Patients Stated Pain Goal: 3 (11/11/18 0631)  Complications: No apparent anesthesia complications

## 2018-11-12 ENCOUNTER — Encounter (HOSPITAL_COMMUNITY): Payer: Self-pay | Admitting: Neurological Surgery

## 2018-11-12 LAB — BASIC METABOLIC PANEL
Anion gap: 6 (ref 5–15)
BUN: 9 mg/dL (ref 8–23)
CHLORIDE: 106 mmol/L (ref 98–111)
CO2: 27 mmol/L (ref 22–32)
CREATININE: 0.79 mg/dL (ref 0.61–1.24)
Calcium: 8.3 mg/dL — ABNORMAL LOW (ref 8.9–10.3)
GFR calc Af Amer: >60 mL/min (ref >60)
GFR calc non Af Amer: >60 mL/min (ref >60)
GLUCOSE: 108 mg/dL — AB (ref 70–99)
POTASSIUM: 4.3 mmol/L (ref 3.5–5.1)
SODIUM: 139 mmol/L (ref 135–145)

## 2018-11-12 LAB — CBC
HCT: 37.1 % — ABNORMAL LOW (ref 39.0–52.0)
HEMOGLOBIN: 12.1 g/dL — AB (ref 13.0–17.0)
MCH: 32.3 pg (ref 26.0–34.0)
MCHC: 32.6 g/dL (ref 30.0–36.0)
MCV: 98.9 fL (ref 80.0–100.0)
Platelets: 140 10*3/uL — ABNORMAL LOW (ref 150–400)
RBC: 3.75 MIL/uL — AB (ref 4.22–5.81)
RDW: 13 % (ref 11.5–15.5)
WBC: 5.7 10*3/uL (ref 4.0–10.5)
nRBC: 0 % (ref 0.0–0.2)

## 2018-11-12 MED ORDER — HYDROMORPHONE HCL 1 MG/ML IJ SOLN
0.5000 mg | INTRAMUSCULAR | Status: DC | PRN
  Administered 2018-11-13 – 2018-11-14 (×3): 0.5 mg via INTRAVENOUS
  Filled 2018-11-12 (×4): qty 0.5

## 2018-11-12 MED ORDER — HYDROMORPHONE HCL 2 MG PO TABS
2.0000 mg | ORAL_TABLET | ORAL | Status: DC | PRN
  Administered 2018-11-12 – 2018-11-13 (×6): 2 mg via ORAL
  Filled 2018-11-12 (×6): qty 1

## 2018-11-12 NOTE — Evaluation (Signed)
Physical Therapy Evaluation Patient Details Name: Douglas DoomJoseph E Pfefferle Jr. MRN: [id number] DOB: 09/25/1956 Today's Date: 11/12/2018   History of Present Illness  Pt is a 62 y/o male s/p L2-L5 anterolateral decompression and fusion. PMH including but not limited to HTN and L2-L5 lami/decompression in 2018.    Clinical Impression  Pt presented supine in bed with HOB elevated, awake and willing to participate in therapy session. Prior to admission, pt reported that he was independent with all functional mobility and ADLs. Pt lives with his wife in a two level home with three steps to enter. Pt will have 24/7 supervision/assistance upon d/c. Pt currently able to perform bed mobility with supervision, transfers with min guard and ambulate in hallway with RW and min guard for safety. Pt able to state 3/3 back precautions at beginning of session that he remembered from his previous surgery in 2018. PT will continue to follow pt acutely to progress mobility as tolerated. Will plan for stair training at next session if appropriate.     Follow Up Recommendations No PT follow up    Equipment Recommendations  None recommended by PT    Recommendations for Other Services       Precautions / Restrictions Precautions Precautions: Fall;Back Precaution Comments: pt able to stated 3/3 back precautions and performed log roll with supervision Required Braces or Orthoses: Spinal Brace Spinal Brace: Lumbar corset;Applied in sitting position Restrictions Weight Bearing Restrictions: No      Mobility  Bed Mobility Overal bed mobility: Needs Assistance Bed Mobility: Rolling;Sidelying to Sit Rolling: Supervision Sidelying to sit: Supervision       General bed mobility comments: pt able to perform log roll with supervision  Transfers Overall transfer level: Needs assistance Equipment used: Rolling walker (2 wheeled) Transfers: Sit to/from Stand Sit to Stand: From elevated surface;Min guard          General transfer comment: min guard for safety; pt pulling up on RW with bilateral UEs despite cueing for safe hand placement  Ambulation/Gait Ambulation/Gait assistance: Min guard Gait Distance (Feet): 100 Feet Assistive device: Rolling walker (2 wheeled) Gait Pattern/deviations: Step-through pattern;Decreased stride length Gait velocity: decreased   General Gait Details: pt steady with RW, no overt LOB or need for physical assistance, min guard for safety  Stairs            Wheelchair Mobility    Modified Rankin (Stroke Patients Only)       Balance Overall balance assessment: Needs assistance Sitting-balance support: Feet supported Sitting balance-Leahy Scale: Good     Standing balance support: During functional activity;Bilateral upper extremity supported Standing balance-Leahy Scale: Poor Standing balance comment: reliant on bilateral UEs on RW for balance                             Pertinent Vitals/Pain Pain Assessment: 0-10 Pain Score: 5  Pain Location: surgical site and bilateral LEs Pain Descriptors / Indicators: Guarding;Sore Pain Intervention(s): Monitored during session;Repositioned    Home Living Family/patient expects to be discharged to:: Private residence Living Arrangements: Spouse/significant other Available Help at Discharge: Family;Available 24 hours/day Type of Home: House Home Access: Stairs to enter Entrance Stairs-Rails: None Entrance Stairs-Number of Steps: 3 Home Layout: Two level;Bed/bath upstairs Home Equipment: Walker - 2 wheels;Shower seat      Prior Function Level of Independence: Independent               Hand Dominance  Extremity/Trunk Assessment   Upper Extremity Assessment Upper Extremity Assessment: Overall WFL for tasks assessed    Lower Extremity Assessment Lower Extremity Assessment: Overall WFL for tasks assessed    Cervical / Trunk Assessment Cervical / Trunk Assessment: Other  exceptions Cervical / Trunk Exceptions: s/p lumbar surgery  Communication   Communication: No difficulties  Cognition Arousal/Alertness: Awake/alert Behavior During Therapy: WFL for tasks assessed/performed Overall Cognitive Status: Within Functional Limits for tasks assessed                                        General Comments      Exercises     Assessment/Plan    PT Assessment Patient needs continued PT services  PT Problem List Decreased activity tolerance;Decreased balance;Decreased mobility;Decreased coordination;Pain       PT Treatment Interventions DME instruction;Gait training;Stair training;Functional mobility training;Therapeutic activities;Therapeutic exercise;Balance training;Patient/family education;Neuromuscular re-education    PT Goals (Current goals can be found in the Care Plan section)  Acute Rehab PT Goals Patient Stated Goal: decrease pain PT Goal Formulation: With patient Time For Goal Achievement: 11/26/18 Potential to Achieve Goals: Good    Frequency Min 5X/week   Barriers to discharge        Co-evaluation               AM-PAC PT "6 Clicks" Mobility  Outcome Measure Help needed turning from your back to your side while in a flat bed without using bedrails?: None Help needed moving from lying on your back to sitting on the side of a flat bed without using bedrails?: None Help needed moving to and from a bed to a chair (including a wheelchair)?: A Little Help needed standing up from a chair using your arms (e.g., wheelchair or bedside chair)?: A Little Help needed to walk in hospital room?: A Little Help needed climbing 3-5 steps with a railing? : A Little 6 Click Score: 20    End of Session Equipment Utilized During Treatment: Back brace;Gait belt Activity Tolerance: Patient tolerated treatment well Patient left: in chair;with call bell/phone within reach Nurse Communication: Mobility status PT Visit Diagnosis:  Other abnormalities of gait and mobility (R26.89);Pain Pain - part of body: (back)    Time: 9528-4132 PT Time Calculation (min) (ACUTE ONLY): 21 min   Charges:   PT Evaluation $PT Eval Moderate Complexity: 1 Mod          Deborah Chalk, PT, DPT  Acute Rehabilitation Services Pager [phone] Office [phone]    Alessandra Bevels Symphanie Cederberg 11/12/2018, 10:19 AM

## 2018-11-12 NOTE — Progress Notes (Signed)
Orthopedic Tech Progress Note Patient Details:  Douglas DoomJoseph E Kita Jr. 03/19/1956 [id number]  Patient ID: Douglas DoomJoseph E Cristiano Jr., male   DOB: 08/10/1956, 62 y.o.   MRN: [id number] Pt already has an aspen lumbar brace. It was fitted by physical therapy. It fit well. Pt does not want a new brace.  [name], [name] 11/12/2018, 9:12 AM

## 2018-11-12 NOTE — Progress Notes (Signed)
Patient admitted from PACU to 3 W 08. A & O x 4. Full assessment to epic completed. Patient oriented to his room, ascom/call bell and staff. Patient demanded for Soma medication, stated it works well with his pain management. Notified V. Costella NP and received [facility] Will continue to monitor.

## 2018-11-12 NOTE — Progress Notes (Addendum)
Occupational Therapy Evaluation Patient Details Name: Douglas Roberts. MRN: [id number] DOB: 1956-09-16 Today's Date: 11/12/2018    History of Present Illness Pt is a 62 y/o male s/p L2-L5 anterolateral decompression and fusion. PMH including but not limited to HTN and L2-L5 lami/decompression in 2018.   Clinical Impression   PTA, pt independent with ADL and mobility. Completed education regarding compensatory strategies to adhere to back precautions. Pt verbalized understanding. Pt states that his wife will assist as needed after DC. Handout reviewed. Educated pt/family on home safety and reducing risk of falls. No further OT needs. OT signing off.     Follow Up Recommendations  No OT follow up;Supervision - Intermittent    Equipment Recommendations  None recommended by OT    Recommendations for Other Services       Precautions / Restrictions Precautions Precautions: Fall;Back Precaution Comments: pt able to stated 3/3 back precautions  Required Braces or Orthoses: Spinal Brace Spinal Brace: Lumbar corset;Applied in sitting position Restrictions Weight Bearing Restrictions: No      Mobility Bed Mobility               General bed mobility comments: OOB in chair  Transfers Overall transfer level: Modified independent                    Balance Overall balance assessment: Needs assistance Sitting-balance support: Feet supported Sitting balance-Leahy Scale: Good       Standing balance-Leahy Scale: Fair Standing balance comment: able to release RW for ADL                           ADL either performed or assessed with clinical judgement   ADL                                         General ADL Comments: completed educaiton regading compensatory techniqeus for ADL; Pt abl eto complete figure four positioning. Pt staes his wife will assist with ADL as needed. Recommend pt use a reacher as needed for ADL; Reviewed home safety  and reducing risk of falls.Handout reviewed. Pt has all DME needed.      Vision         Perception     Praxis      Pertinent Vitals/Pain Pain Assessment: 0-10 Pain Score: 4  Pain Location: surgical site and bilateral LEs Pain Descriptors / Indicators: Guarding;Sore Pain Intervention(s): Limited activity within patient's tolerance     Hand Dominance Right   Extremity/Trunk Assessment Upper Extremity Assessment Upper Extremity Assessment: Overall WFL for tasks assessed   Lower Extremity Assessment Lower Extremity Assessment: Defer to PT evaluation   Cervical / Trunk Assessment Cervical / Trunk Assessment: Other exceptions Cervical / Trunk Exceptions: s/p lumbar surgery   Communication Communication Communication: No difficulties   Cognition Arousal/Alertness: Awake/alert Behavior During Therapy: WFL for tasks assessed/performed Overall Cognitive Status: Within Functional Limits for tasks assessed                                     General Comments       Exercises     Shoulder Instructions      Home Living Family/patient expects to be discharged to:: Private residence Living Arrangements: Spouse/significant other Available Help at Discharge: Family;Available  24 hours/day Type of Home: House Home Access: Stairs to enter Entergy CorporationEntrance Stairs-Number of Steps: 3 Entrance Stairs-Rails: None Home Layout: Two level;Bed/bath upstairs Alternate Level Stairs-Number of Steps: flight Alternate Level Stairs-Rails: Right;Left Bathroom Shower/Tub: Producer, television/film/videoWalk-in shower   Bathroom Toilet: Handicapped height Bathroom Accessibility: Yes   Home Equipment: Environmental consultantWalker - 2 wheels;Shower seat;Bedside commode   Additional Comments: thinks he has a Sports administratorreacher      Prior Functioning/Environment Level of Independence: Independent                 OT Problem List: Decreased range of motion;Decreased activity tolerance;Impaired sensation;Pain      OT  Treatment/Interventions:      OT Goals(Current goals can be found in the care plan section) Acute Rehab OT Goals Patient Stated Goal: decrease pain OT Goal Formulation: All assessment and education complete, DC therapy  OT Frequency:     Barriers to D/C:            Co-evaluation              AM-PAC OT "6 Clicks" Daily Activity     Outcome Measure Help from another person eating meals?: None Help from another person taking care of personal grooming?: None Help from another person toileting, which includes using toliet, bedpan, or urinal?: A Little Help from another person bathing (including washing, rinsing, drying)?: A Little Help from another person to put on and taking off regular upper body clothing?: None Help from another person to put on and taking off regular lower body clothing?: A Little 6 Click Score: 21   End of Session Equipment Utilized During Treatment: Rolling walker;Back brace Nurse Communication: Mobility status  Activity Tolerance: Patient tolerated treatment well Patient left: in chair;with call bell/phone within reach  OT Visit Diagnosis: Pain;Muscle weakness (generalized) (M62.81) Pain - part of body: (back)                Time: [id number] OT Time Calculation (min): 18 min Charges:  OT General Charges $OT Visit: 1 Visit OT Evaluation $OT Eval Low Complexity: 1 Low  Shifra Swartzentruber, OT/L   Acute OT Clinical Specialist Acute Rehabilitation Services Pager [phone] Office [phone]   [facility] LLCWARD,HILLARY 11/12/2018, 4:32 PM

## 2018-11-12 NOTE — Progress Notes (Signed)
Patient ID: [name]., male   DOB: 12/08/1956, 62 y.o.   MRN: [id number] Awake comfortable with his back. Vital signs are stable Has not ambulated yet Patient does not tolerate Percocet well pain medication is now changed to Dilaudid.  His incisions are clean and dry.  We will see how he does with physical therapy today.  Postoperative hematocrit is 37.

## 2018-11-13 MED ORDER — DEXAMETHASONE SODIUM PHOSPHATE 10 MG/ML IJ SOLN
10.0000 mg | Freq: Four times a day (QID) | INTRAMUSCULAR | Status: AC
  Administered 2018-11-13 (×2): 10 mg via INTRAVENOUS
  Filled 2018-11-13 (×2): qty 1

## 2018-11-13 MED ORDER — HYDROMORPHONE HCL 2 MG PO TABS
4.0000 mg | ORAL_TABLET | ORAL | Status: DC | PRN
  Administered 2018-11-13 – 2018-11-16 (×13): 4 mg via ORAL
  Filled 2018-11-13 (×13): qty 2

## 2018-11-13 MED ORDER — PANTOPRAZOLE SODIUM 40 MG PO TBEC
40.0000 mg | DELAYED_RELEASE_TABLET | Freq: Every day | ORAL | Status: DC
  Administered 2018-11-13 – 2018-11-16 (×4): 40 mg via ORAL
  Filled 2018-11-13 (×5): qty 1

## 2018-11-13 NOTE — Progress Notes (Signed)
Physical Therapy Treatment Patient Details Name: Douglas DoomJoseph E Denno Jr. MRN: [id number] DOB: 01/24/1956 Today's Date: 11/13/2018    History of Present Illness Pt is a 62 y/o male s/p L2-L5 anterolateral decompression and fusion. PMH including but not limited to HTN and L2-L5 lami/decompression in 2018.    PT Comments    Patient's tolerance to treatment today was good.  Patient was in bed with back brace donned with no visitors present upon PT arrival.  Patient reported pain in his low back interrupted his rest last night.  Pt initially not agreeable to ambulation due to not feeling well from lack of sleep.  Treatment initiated with supine LE exercises.  Pt became agreeable to ambulation after therapist provided continued education and encouragement.  Patient continues to benefit from acute care PT until discharge.  Patient remains appropriate for no PT follow up.    Follow Up Recommendations  No PT follow up     Equipment Recommendations  None recommended by PT    Recommendations for Other Services       Precautions / Restrictions Precautions Precautions: Fall;Back Precaution Comments: pt able to stated 3/3 back precautions and performed log roll with supervision Required Braces or Orthoses: Spinal Brace Spinal Brace: Lumbar corset;Applied in sitting position Restrictions Weight Bearing Restrictions: No    Mobility  Bed Mobility Overal bed mobility: Needs Assistance Bed Mobility: Rolling;Sidelying to Sit Rolling: Min guard Sidelying to sit: Min guard       General bed mobility comments: Pt required min guard for safety and VC to bend knees prior to rolling.  Pt was able to move B LE slowly over EOB.  Pt was able to boost into sitting with B UE.  Transfers Overall transfer level: Needs assistance Equipment used: Rolling walker (2 wheeled) Transfers: Sit to/from Stand Sit to Stand: From elevated surface;Min guard         General transfer comment: pt continues to require  min guard for safety.  VC provided for safe hand placement on bed surface.  Pt continues to demonstrate tendency to pull from RW to stand.  Ambulation/Gait Ambulation/Gait assistance: Min guard Gait Distance (Feet): 130 Feet Assistive device: Rolling walker (2 wheeled) Gait Pattern/deviations: Step-through pattern;Decreased stride length Gait velocity: decreased   General Gait Details: pt steady with RW, no overt LOB or need for physical assistance, min guard for safety   Stairs             Wheelchair Mobility    Modified Rankin (Stroke Patients Only)       Balance Overall balance assessment: Needs assistance Sitting-balance support: Feet supported Sitting balance-Leahy Scale: Good     Standing balance support: During functional activity;Bilateral upper extremity supported Standing balance-Leahy Scale: Fair Standing balance comment: able to release RW while cleaning hands after self-pericare.                            Cognition Arousal/Alertness: Awake/alert Behavior During Therapy: WFL for tasks assessed/performed Overall Cognitive Status: Within Functional Limits for tasks assessed                                        Exercises General Exercises - Lower Extremity Heel Slides: AROM;Both;10 reps;Supine Hip ABduction/ADduction: AROM;Right;10 reps;Supine(deferred exercise on L side due to pt's report of pain and spasm)    General Comments  Pertinent Vitals/Pain Pain Assessment: 0-10 Pain Score: 6  Pain Location: Low back(pt reported 4/10 in L thigh) Pain Descriptors / Indicators: Discomfort Pain Intervention(s): Limited activity within patient's tolerance;Monitored during session;Repositioned    Home Living                      Prior Function            PT Goals (current goals can now be found in the care plan section) Acute Rehab PT Goals Patient Stated Goal: none stated PT Goal Formulation: With  patient Time For Goal Achievement: 11/26/18 Potential to Achieve Goals: Good Progress towards PT goals: Progressing toward goals    Frequency    Min 5X/week      PT Plan      Co-evaluation              AM-PAC PT "6 Clicks" Mobility   Outcome Measure  Help needed turning from your back to your side while in a flat bed without using bedrails?: None Help needed moving from lying on your back to sitting on the side of a flat bed without using bedrails?: None Help needed moving to and from a bed to a chair (including a wheelchair)?: A Little Help needed standing up from a chair using your arms (e.g., wheelchair or bedside chair)?: A Little Help needed to walk in hospital room?: A Little Help needed climbing 3-5 steps with a railing? : A Little 6 Click Score: 20    End of Session Equipment Utilized During Treatment: Back brace;Gait belt Activity Tolerance: Patient tolerated treatment well Patient left: in chair;with call bell/phone within reach Nurse Communication: Mobility status PT Visit Diagnosis: Other abnormalities of gait and mobility (R26.89);Pain     Time: 1610-9604 PT Time Calculation (min) (ACUTE ONLY): 23 min  Charges:  $Gait Training: 8-22 mins $Therapeutic Activity: 8-22 mins                     [address] SPTA    Abigayl Hor 11/13/2018, 11:11 AM

## 2018-11-13 NOTE — Progress Notes (Signed)
P.t. Request insertion of note for concern of not having a BM. Bowel sounds are active, stomach is soft & non-tender.

## 2018-11-13 NOTE — Progress Notes (Signed)
Subjective: Patient reports doing well condition of back and anterior quad pain  Objective: Vital signs in last 24 hours: Temp:  [98.2 F (36.8 C)-99.7 F (37.6 C)] 98.6 F (37 C) (11/28 0917) Pulse Rate:  [64-83] 83 (11/28 0917) Resp:  [15-18] 15 (11/28 0917) BP: (102-145)/(68-86) 133/75 (11/28 0917) SpO2:  [93 %-97 %] 93 % (11/28 0917)  Intake/Output from previous day: 11/27 0701 - 11/28 0700 In: -  Out: 500 [Urine:500] Intake/Output this shift: No intake/output data recorded.  strength 5/5 and incison clean dry and intact  Lab Results: Recent Labs    11/12/18 0544  WBC 5.7  HGB 12.1*  HCT 37.1*  PLT 140*   BMET Recent Labs    11/12/18 0544  NA 139  K 4.3  CL 106  CO2 27  GLUCOSE 108*  BUN 9  CREATININE 0.79  CALCIUM 8.3*    Studies/Results: Dg Lumbar Spine 2-3 Views  Result Date: 11/11/2018 CLINICAL DATA:  Lumbar Two-Three, Lumbar Three-Four, Lumbar Four-Five Anterolateral decompression/interbody fusion with posterior percutaneous arthrodesis Lumbar Two to Lumbar Five with Mazor Dr. Edwyna ReadyElsner Moses EXAM: LUMBAR SPINE - 2-3 VIEW; DG C-ARM 61-120 MIN COMPARISON:  CT 10/27/2018 FINDINGS: 2 fluoroscopic spot images document changes of PLIF L2-L5. Graft markers in the interspaces project in expected location. Bilateral pedicle screws at each level and vertical interconnecting rods are intact, project in expected location. IMPRESSION: Interval PLIF  L2-L5 without apparent complication. Electronically Signed   By: Corlis Leak  Hassell M.D.   On: 11/11/2018 13:22   Dg C-arm 1-60 Min  Result Date: 11/11/2018 CLINICAL DATA:  Lumbar Two-Three, Lumbar Three-Four, Lumbar Four-Five Anterolateral decompression/interbody fusion with posterior percutaneous arthrodesis Lumbar Two to Lumbar Five with Mazor Dr. Edwyna ReadyElsner Moses EXAM: LUMBAR SPINE - 2-3 VIEW; DG C-ARM 61-120 MIN COMPARISON:  CT 10/27/2018 FINDINGS: 2 fluoroscopic spot images document changes of PLIF L2-L5. Graft markers in the  interspaces project in expected location. Bilateral pedicle screws at each level and vertical interconnecting rods are intact, project in expected location. IMPRESSION: Interval PLIF  L2-L5 without apparent complication. Electronically Signed   By: Corlis Leak  Hassell M.D.   On: 11/11/2018 13:22   Dg C-arm 1-60 Min  Result Date: 11/11/2018 CLINICAL DATA:  Lumbar Two-Three, Lumbar Three-Four, Lumbar Four-Five Anterolateral decompression/interbody fusion with posterior percutaneous arthrodesis Lumbar Two to Lumbar Five with Mazor Dr. Edwyna ReadyElsner Moses EXAM: LUMBAR SPINE - 2-3 VIEW; DG C-ARM 61-120 MIN COMPARISON:  CT 10/27/2018 FINDINGS: 2 fluoroscopic spot images document changes of PLIF L2-L5. Graft markers in the interspaces project in expected location. Bilateral pedicle screws at each level and vertical interconnecting rods are intact, project in expected location. IMPRESSION: Interval PLIF  L2-L5 without apparent complication. Electronically Signed   By: Corlis Leak  Hassell M.D.   On: 11/11/2018 13:22   Dg C-arm 1-60 Min  Result Date: 11/11/2018 CLINICAL DATA:  Lumbar Two-Three, Lumbar Three-Four, Lumbar Four-Five Anterolateral decompression/interbody fusion with posterior percutaneous arthrodesis Lumbar Two to Lumbar Five with Mazor Dr. Edwyna ReadyElsner Moses EXAM: LUMBAR SPINE - 2-3 VIEW; DG C-ARM 61-120 MIN COMPARISON:  CT 10/27/2018 FINDINGS: 2 fluoroscopic spot images document changes of PLIF L2-L5. Graft markers in the interspaces project in expected location. Bilateral pedicle screws at each level and vertical interconnecting rods are intact, project in expected location. IMPRESSION: Interval PLIF  L2-L5 without apparent complication. Electronically Signed   By: Corlis Leak  Hassell M.D.   On: 11/11/2018 13:22    Assessment/Plan: POD 2 making expected progress.  Adjust pain meds and mobilize with PT  LOS: 2 days     Douglas Roberts P 11/13/2018, 9:26 AM

## 2018-11-14 ENCOUNTER — Inpatient Hospital Stay (HOSPITAL_COMMUNITY): Payer: BC Managed Care – PPO

## 2018-11-14 ENCOUNTER — Encounter (HOSPITAL_COMMUNITY): Payer: Self-pay

## 2018-11-14 MED ORDER — MAGNESIUM HYDROXIDE 400 MG/5ML PO SUSP
30.0000 mL | Freq: Every day | ORAL | Status: DC | PRN
  Administered 2018-11-14 (×2): 30 mL via ORAL
  Filled 2018-11-14 (×3): qty 30

## 2018-11-14 MED FILL — Thrombin For Soln 5000 Unit: CUTANEOUS | Qty: 5000 | Status: AC

## 2018-11-14 MED FILL — Gelatin Absorbable MT Powder: OROMUCOSAL | Qty: 1 | Status: AC

## 2018-11-14 NOTE — Progress Notes (Signed)
Patient ID: Douglas DoomJoseph E Grulke Jr., male   DOB: 01/16/1956, 62 y.o.   MRN: [id number] Vital signs are stable Motor function appears to be doing quite well.  Patient notes for issues of concern for him.  He is concerned about his pain medication control and that he is on the regimen that he will be discharged on.  He did have a couple of doses of IV Dilaudid which seemed to help control his pain.  He will have to be on oral Dilaudid in order to be discharged home  Is also concerned about his bowels and constipation which was an issue in his previous surgery.  Will add milk of magnesia today and I encouraged him to walk to see if this would help to stimulate his bowels to move.  Thirdly he like to have more physical therapy in the hospital to make sure that he can negotiate stairs which he has to do at home.  Lastly he would like to obtain a radiograph to make sure that all his fixation is in good position and how obtain this for him today. Overall he seems to be doing reasonably well thigh pain that he had on the left front of the thigh seems to be decreasing.

## 2018-11-14 NOTE — Progress Notes (Signed)
Physical Therapy Treatment Patient Details Name: Douglas Roberts. MRN: [id number] DOB: 1956/09/18 Today's Date: 11/14/2018    History of Present Illness Pt is a 62 y/o male s/p L2-L5 anterolateral decompression and fusion. PMH including but not limited to HTN and L2-L5 lami/decompression in 2018.    PT Comments    Patient's tolerance to treatment today was good.  Patient was sitting in chair with wife and daughter present upon PT arrival.  Pt participated in stair training today and required min guard for safety.  VC were provided for proper technique.  Therapist educated wife on how to assist pt with climbing stairs at home.  Therapist attempted to educate pt and his wife on donning and doffing lumbar corset brace but they stated they felt comfortable with brace application with no questions presented to therapist.  Pt continues to be appropriate for no PT follow up at this time.   Follow Up Recommendations  No PT follow up     Equipment Recommendations  None recommended by PT    Recommendations for Other Services       Precautions / Restrictions Precautions Precautions: Fall;Back Precaution Comments: pt able to stated 3/3 back precautions and performed log roll with supervision Required Braces or Orthoses: Spinal Brace Spinal Brace: Lumbar corset;Applied in sitting position Restrictions Weight Bearing Restrictions: No    Mobility  Bed Mobility                  Transfers Overall transfer level: Needs assistance Equipment used: Rolling walker (2 wheeled) Transfers: Sit to/from Stand Sit to Stand: Supervision         General transfer comment: supervision for safety and VC to reach for bed surface prior to sitting.  Ambulation/Gait Ambulation/Gait assistance: Supervision Gait Distance (Feet): 65 Feet Assistive device: Rolling walker (2 wheeled) Gait Pattern/deviations: Step-through pattern;Decreased stride length Gait velocity: decreased   General Gait  Details: pt steady with RW, no overt LOB or need for physical assistance, supervision for safety.   Stairs Stairs: Yes Stairs assistance: Min guard Stair Management: One rail Right;Step to pattern;Forwards Number of Stairs: (3 trials of 1 flight) General stair comments: Pt required min guard for safety and VC for proper technique of ascending and descending stairs.   Wheelchair Mobility    Modified Rankin (Stroke Patients Only)       Balance Overall balance assessment: Needs assistance Sitting-balance support: Feet supported Sitting balance-Leahy Scale: Good     Standing balance support: During functional activity;Bilateral upper extremity supported Standing balance-Leahy Scale: Fair                              Cognition Arousal/Alertness: Awake/alert Behavior During Therapy: WFL for tasks assessed/performed Overall Cognitive Status: Within Functional Limits for tasks assessed                                        Exercises      General Comments        Pertinent Vitals/Pain Pain Assessment: Faces Faces Pain Scale: Hurts a little bit Pain Location: radiating symptoms to B LE Pain Descriptors / Indicators: Tingling;Numbness Pain Intervention(s): Limited activity within patient's tolerance;Monitored during session;Repositioned    Home Living                      Prior Function  PT Goals (current goals can now be found in the care plan section) Acute Rehab PT Goals Patient Stated Goal: none stated PT Goal Formulation: With patient Time For Goal Achievement: 11/26/18 Potential to Achieve Goals: Good Progress towards PT goals: Progressing toward goals    Frequency    Min 5X/week      PT Plan      Co-evaluation              AM-PAC PT "6 Clicks" Mobility   Outcome Measure  Help needed turning from your back to your side while in a flat bed without using bedrails?: None Help needed moving from  lying on your back to sitting on the side of a flat bed without using bedrails?: None Help needed moving to and from a bed to a chair (including a wheelchair)?: A Little Help needed standing up from a chair using your arms (e.g., wheelchair or bedside chair)?: A Little Help needed to walk in hospital room?: None Help needed climbing 3-5 steps with a railing? : A Little 6 Click Score: 21    End of Session Equipment Utilized During Treatment: Back brace;Gait belt Activity Tolerance: Patient tolerated treatment well Patient left: in bed;with call bell/phone within reach;with family/visitor present Nurse Communication: Mobility status PT Visit Diagnosis: Other abnormalities of gait and mobility (R26.89);Pain     Time: [id number] PT Time Calculation (min) (ACUTE ONLY): 12 min  Charges:  $Gait Training: 8-22 mins                     [id number] North High LaneMegan Loraine Roberts, SPTA   Douglas Roberts 11/14/2018, 1:08 PM

## 2018-11-15 MED ORDER — BISACODYL 10 MG RE SUPP: 10.0000 mg | Freq: Every day | RECTAL | Status: DC | PRN

## 2018-11-15 MED ORDER — KETOROLAC TROMETHAMINE 15 MG/ML IJ SOLN
15.0000 mg | Freq: Four times a day (QID) | INTRAMUSCULAR | Status: DC | PRN
  Administered 2018-11-15: 15 mg via INTRAVENOUS
  Filled 2018-11-15 (×2): qty 1

## 2018-11-15 NOTE — Progress Notes (Signed)
Patient ID: Douglas Roberts., male   DOB: 10/30/1956, 62 y.o.   MRN: [id number] Vital signs are stable Patient has done well with physical therapy He requests a hospital bed for home use His bowels have not moved yet This may be partly related to pain medication We will add Toradol for a brief period of time to see if this will lessen the need for Dilaudid Also add enema in addition to suppository He is doing better Hopeful for discharge soon

## 2018-11-15 NOTE — Progress Notes (Signed)
Physical Therapy Treatment Patient Details Name: Darron DoomJoseph E Cena Jr. MRN: [id number] DOB: 01/26/1956 Today's Date: 11/15/2018    History of Present Illness Pt is a 62 y/o male s/p L2-L5 anterolateral decompression and fusion. PMH including but not limited to HTN and L2-L5 lami/decompression in 2018.    PT Comments    Patient is making good progress with PT.  From a mobility standpoint anticipate patient will be ready for DC home when medically ready.    Follow Up Recommendations  No PT follow up     Equipment Recommendations  None recommended by PT    Recommendations for Other Services       Precautions / Restrictions Precautions Precautions: Fall;Back Precaution Comments: pt able to recall 3/3 back precautions  Required Braces or Orthoses: Spinal Brace Spinal Brace: Lumbar corset;Applied in sitting position Restrictions Weight Bearing Restrictions: No    Mobility  Bed Mobility               General bed mobility comments: pt OOB in chair upon arrival  Transfers Overall transfer level: Modified independent Equipment used: Rolling walker (2 wheeled) Transfers: Sit to/from Stand           General transfer comment: use of RW upon standing  Ambulation/Gait Ambulation/Gait assistance: Modified independent (Device/Increase time) Gait Distance (Feet): 500 Feet Assistive device: Rolling walker (2 wheeled) Gait Pattern/deviations: Step-through pattern     General Gait Details: cues for engaging abdominal muscles for improved posture   Stairs             Wheelchair Mobility    Modified Rankin (Stroke Patients Only)       Balance Overall balance assessment: Needs assistance Sitting-balance support: Feet supported Sitting balance-Leahy Scale: Good     Standing balance support: During functional activity;Bilateral upper extremity supported Standing balance-Leahy Scale: Fair                              Cognition Arousal/Alertness:  Awake/alert Behavior During Therapy: WFL for tasks assessed/performed Overall Cognitive Status: Within Functional Limits for tasks assessed                                        Exercises      General Comments        Pertinent Vitals/Pain Pain Assessment: Faces Faces Pain Scale: Hurts a little bit Pain Location: L thigh discomfort/pain; tingling feet Pain Intervention(s): Monitored during session;Premedicated before session;Repositioned    Home Living                      Prior Function            PT Goals (current goals can now be found in the care plan section) Acute Rehab PT Goals Patient Stated Goal: none stated Progress towards PT goals: Progressing toward goals    Frequency    Min 5X/week      PT Plan Current plan remains appropriate    Co-evaluation              AM-PAC PT "6 Clicks" Mobility   Outcome Measure  Help needed turning from your back to your side while in a flat bed without using bedrails?: A Little Help needed moving from lying on your back to sitting on the side of a flat bed without using bedrails?: A Little Help  needed moving to and from a bed to a chair (including a wheelchair)?: None Help needed standing up from a chair using your arms (e.g., wheelchair or bedside chair)?: None Help needed to walk in hospital room?: None Help needed climbing 3-5 steps with a railing? : A Little 6 Click Score: 21    End of Session Equipment Utilized During Treatment: Back brace Activity Tolerance: Patient tolerated treatment well Patient left: with call bell/phone within reach;in chair Nurse Communication: Mobility status PT Visit Diagnosis: Other abnormalities of gait and mobility (R26.89);Pain Pain - part of body: (back)     Time: 1610-9604 PT Time Calculation (min) (ACUTE ONLY): 20 min  Charges:  $Gait Training: 8-22 mins                     Erline Levine, PTA Acute Rehabilitation Services Pager: [phone] Office: [phone]     Carolynne Edouard 11/15/2018, 8:57 AM

## 2018-11-16 MED ORDER — CARISOPRODOL 350 MG PO TABS: 350.0000 mg | ORAL_TABLET | Freq: Three times a day (TID) | ORAL | 0 refills | Status: DC | PRN

## 2018-11-16 MED ORDER — HYDROMORPHONE HCL 4 MG PO TABS: ORAL_TABLET | ORAL | 0 refills | Status: DC

## 2018-11-16 MED ORDER — METHOCARBAMOL 500 MG PO TABS: 500.0000 mg | ORAL_TABLET | Freq: Four times a day (QID) | ORAL | 3 refills | Status: DC | PRN

## 2018-11-16 NOTE — Discharge Summary (Signed)
Physician Discharge Summary  Patient ID: Darron DoomJoseph E Outland Jr. MRN: [id number] DOB/AGE: 64/12/1955 64 y.o.  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  Postoperative constipation Active Problems:   Lumbar stenosis with neurogenic claudication   Discharged Condition: good  Hospital Course: Patient was admitted to undergo surgical decompression at L2-3 L3-4 and L4-5 using an indirect technique.  He tolerated surgery well.  He did have some problems with postoperative constipation but these resolved after decreasing his narcotic pain medication.  Consults: None  Significant Diagnostic Studies: None  Treatments: surgery: Anterolateral decompression L2-3 L3-4 L4-5 posterior percutaneous fixation L2-L5 with pedicle screws  Discharge Exam: Blood pressure 133/81, pulse 66, temperature 97.9 F (36.6 C), temperature source Oral, resp. rate 18, height 6\' 2"  (1.88 m), weight 109.5 kg, SpO2 98 %. Incision is clean dry motor function is intact  Disposition: Discharge disposition: 01-Home or Self Care       Discharge Instructions    Call MD for:  redness, tenderness, or signs of infection (pain, swelling, redness, odor or green/yellow discharge around incision site)   Complete by:  As directed    Call MD for:  severe uncontrolled pain   Complete by:  As directed    Call MD for:  temperature >100.4   Complete by:  As directed    Diet - low sodium heart healthy   Complete by:  As directed    Discharge instructions   Complete by:  As directed    Okay to shower. Do not apply salves or appointments to incision. No heavy lifting with the upper extremities greater than 15 pounds. May resume driving when not requiring pain medication and patient  feels comfortable with doing so.   Incentive spirometry RT   Complete by:  As directed    Increase activity slowly   Complete by:  As directed      Allergies as of 11/16/2018      Reactions   Bee Venom Anaphylaxis      Medication List    TAKE these medications   atenolol 25 MG tablet Commonly known as:  TENORMIN Take 25 mg by mouth daily at 6 PM.   carisoprodol 350 MG tablet Commonly known as:  SOMA Take 1 tablet (350 mg total) by mouth 3 (three) times daily as needed for muscle spasms (pain).   celecoxib 200 MG capsule Commonly known as:  CELEBREX Take 1 capsule (200 mg total) 2 (two) times daily by mouth.   docusate sodium 100 MG capsule Commonly known as:  COLACE Take 100 mg by mouth daily at 6 (six) AM.   doxazosin 2 MG tablet Commonly known as:  CARDURA Take 2 mg by mouth daily at 6 PM.   Fiber Caps Take 2 capsules by mouth 2 (two) times daily.   fish oil-omega-3 fatty acids 1000 MG capsule Take 2 g by mouth daily at 6 PM.   gabapentin 300 MG capsule Commonly known as:  NEURONTIN Take 1 capsule (300 mg total) 2 (two) times daily by mouth.   HYDROmorphone 4 MG tablet Commonly known as:  DILAUDID 1 or 2 tablets every 4 hours as needed for pain control   ibuprofen 200 MG tablet Commonly known as:  ADVIL,MOTRIN Take 400 mg by mouth 2 (two) times daily as needed (for pain.).   ICY HOT  EX Apply 1 application topically daily as needed (pain).   methocarbamol 500 MG tablet Commonly known as:  ROBAXIN Take 1 tablet (500 mg total) by mouth every 6 (six) hours as needed for muscle spasms.   pravastatin 40 MG tablet Commonly known as:  PRAVACHOL Take 40 mg by mouth daily at 6 PM.   PROBIOTIC PO Take 1 capsule by mouth daily at 6 (six) AM.   Saw Palmetto 450 MG Caps Take 450 mg by mouth daily at 6 PM.   sodium chloride 0.65 % Soln nasal spray Commonly known as:  OCEAN Place 1 spray into both nostrils 3 (three) times daily.            Durable  Medical Equipment  (From admission, onward)         Start     Ordered   11/15/18 1034  For home use only DME Hospital bed  Once    Question Answer Comment  Head must be elevated greater than: 45 degrees   Bed type Semi-electric      11/15/18 1036           Signed: Shary Key Veria Stradley 11/16/2018, 9:10 AM

## 2018-11-16 NOTE — Progress Notes (Signed)
Patient ID: [name], male   DOB: 05/03/1956, 62 y.o.   MRN: [id number] Vital signs are stable Patient feels much better today after bowel movement yesterday Toradol seemed to lessen his dependence on the Dilaudid though briefly Patient wishes to go home today if he can get his electric hospital bed for home use today we will see what we can do to arrange this.

## 2018-11-16 NOTE — Care Management (Signed)
    Durable Medical Equipment  (From admission, onward)         Start     Ordered   11/15/18 1034  For home use only DME Hospital bed  Once    Question Answer Comment  Head must be elevated greater than: 45 degrees   Bed type Semi-electric      11/15/18 1036

## 2018-11-16 NOTE — Care Management Note (Signed)
Case Management Note  Patient Details  Name: Douglas DoomJoseph E Shadowens Jr. MRN: [id number] Date of Birth: 05/30/1956  Subjective/Objective:                    Action/Plan:  Hospital bed to be delivered to house today btwn 1-5. Patient made aware and his wife be able to accept delivery. No other CM needs.    Expected Discharge Date:  11/16/18               Expected Discharge Plan:  Home/Self Care  In-House Referral:     Discharge planning Services  CM Consult  Post Acute Care Choice:  Durable Medical Equipment Choice offered to:     DME Arranged:  Hospital bed DME Agency:  [facility]  [facility]     Status of Service:  Completed, signed off  If discussed at Long Length of Stay Meetings, dates discussed:    Additional Comments:  Lawerance SabalDebbie Daymond Cordts, RN 11/16/2018, 9:42 AM

## 2019-03-25 ENCOUNTER — Other Ambulatory Visit: Payer: Self-pay | Admitting: Neurosurgery

## 2019-03-25 DIAGNOSIS — M5416 Radiculopathy, lumbar region: Secondary | ICD-10-CM

## 2019-03-30 ENCOUNTER — Ambulatory Visit
Admission: RE | Admit: 2019-03-30 | Discharge: 2019-03-30 | Disposition: A | Discharge status: Home / Self Care | Payer: BC Managed Care – PPO | Source: Ambulatory Visit | Attending: Neurosurgery | Admitting: Neurosurgery

## 2019-03-30 ENCOUNTER — Other Ambulatory Visit: Payer: Self-pay

## 2019-03-30 DIAGNOSIS — M5416 Radiculopathy, lumbar region: Secondary | ICD-10-CM

## 2019-03-30 MED ORDER — GADOBENATE DIMEGLUMINE 529 MG/ML IV SOLN
20.0000 mL | Freq: Once | INTRAVENOUS | Status: AC | PRN
  Administered 2019-03-30: 10:00:00 20 mL via INTRAVENOUS

## 2019-05-07 ENCOUNTER — Other Ambulatory Visit: Payer: BC Managed Care – PPO

## 2019-11-09 ENCOUNTER — Other Ambulatory Visit: Payer: Self-pay | Admitting: Neurological Surgery

## 2020-05-04 ENCOUNTER — Encounter (HOSPITAL_COMMUNITY): Payer: Self-pay | Admitting: Emergency Medicine

## 2020-05-04 ENCOUNTER — Other Ambulatory Visit: Payer: Self-pay

## 2020-05-04 ENCOUNTER — Emergency Department (HOSPITAL_COMMUNITY)
Admission: EM | Admit: 2020-05-04 | Discharge: 2020-05-04 | Disposition: A | Discharge status: Home / Self Care | Payer: BC Managed Care – PPO | Attending: Emergency Medicine | Admitting: Emergency Medicine

## 2020-05-04 ENCOUNTER — Emergency Department (HOSPITAL_COMMUNITY): Payer: BC Managed Care – PPO

## 2020-05-04 DIAGNOSIS — Y999 Unspecified external cause status: Secondary | ICD-10-CM | POA: Insufficient documentation

## 2020-05-04 DIAGNOSIS — W19XXXA Unspecified fall, initial encounter: Secondary | ICD-10-CM

## 2020-05-04 DIAGNOSIS — Z79899 Other long term (current) drug therapy: Secondary | ICD-10-CM | POA: Insufficient documentation

## 2020-05-04 DIAGNOSIS — S060X0A Concussion without loss of consciousness, initial encounter: Secondary | ICD-10-CM | POA: Insufficient documentation

## 2020-05-04 DIAGNOSIS — I951 Orthostatic hypotension: Secondary | ICD-10-CM | POA: Insufficient documentation

## 2020-05-04 DIAGNOSIS — W0110XA Fall on same level from slipping, tripping and stumbling with subsequent striking against unspecified object, initial encounter: Secondary | ICD-10-CM | POA: Insufficient documentation

## 2020-05-04 DIAGNOSIS — I10 Essential (primary) hypertension: Secondary | ICD-10-CM | POA: Diagnosis not present

## 2020-05-04 DIAGNOSIS — Y9389 Activity, other specified: Secondary | ICD-10-CM | POA: Diagnosis not present

## 2020-05-04 DIAGNOSIS — S0990XA Unspecified injury of head, initial encounter: Secondary | ICD-10-CM | POA: Diagnosis present

## 2020-05-04 DIAGNOSIS — Y92003 Bedroom of unspecified non-institutional (private) residence as the place of occurrence of the external cause: Secondary | ICD-10-CM | POA: Insufficient documentation

## 2020-05-04 LAB — BASIC METABOLIC PANEL
Anion gap: 16 — ABNORMAL HIGH (ref 5–15)
BUN: 7 mg/dL — ABNORMAL LOW (ref 8–23)
CO2: 25 mmol/L (ref 22–32)
Calcium: 9.5 mg/dL (ref 8.9–10.3)
Chloride: 99 mmol/L (ref 98–111)
Creatinine, Ser: 0.82 mg/dL (ref 0.61–1.24)
GFR calc Af Amer: >60 mL/min (ref >60)
GFR calc non Af Amer: >60 mL/min (ref >60)
Glucose, Bld: 89 mg/dL (ref 70–99)
Potassium: 4.6 mmol/L (ref 3.5–5.1)
Sodium: 140 mmol/L (ref 135–145)

## 2020-05-04 LAB — CBC WITH DIFFERENTIAL/PLATELET
Abs Immature Granulocytes: 0.05 10*3/uL (ref 0.00–0.07)
Basophils Absolute: 0 10*3/uL (ref 0.0–0.1)
Basophils Relative: 1 %
Eosinophils Absolute: 0 10*3/uL (ref 0.0–0.5)
Eosinophils Relative: 1 %
HCT: 45.2 % (ref 39.0–52.0)
Hemoglobin: 14.9 g/dL (ref 13.0–17.0)
Immature Granulocytes: 1 %
Lymphocytes Relative: 22 %
Lymphs Abs: 1.4 10*3/uL (ref 0.7–4.0)
MCH: 33.2 pg (ref 26.0–34.0)
MCHC: 33 g/dL (ref 30.0–36.0)
MCV: 100.7 fL — ABNORMAL HIGH (ref 80.0–100.0)
Monocytes Absolute: 0.4 10*3/uL (ref 0.1–1.0)
Monocytes Relative: 7 %
Neutro Abs: 4.4 10*3/uL (ref 1.7–7.7)
Neutrophils Relative %: 68 %
Platelets: 184 10*3/uL (ref 150–400)
RBC: 4.49 MIL/uL (ref 4.22–5.81)
RDW: 12.7 % (ref 11.5–15.5)
WBC: 6.3 10*3/uL (ref 4.0–10.5)
nRBC: 0 % (ref 0.0–0.2)

## 2020-05-04 LAB — ETHANOL: Alcohol, Ethyl (B): 256 mg/dL — ABNORMAL HIGH (ref <10)

## 2020-05-04 MED ORDER — SODIUM CHLORIDE 0.9 % IV BOLUS (SEPSIS)
1000.0000 mL | Freq: Once | INTRAVENOUS | Status: AC
  Administered 2020-05-04: 1000 mL via INTRAVENOUS

## 2020-05-04 MED ORDER — LACTATED RINGERS IV BOLUS
1000.0000 mL | Freq: Once | INTRAVENOUS | Status: AC
  Administered 2020-05-04: 1000 mL via INTRAVENOUS

## 2020-05-04 NOTE — ED Provider Notes (Signed)
[facility] EMERGENCY DEPARTMENT Provider Note   CSN: [id number] Arrival date & time: 05/04/20  0039     History Chief Complaint  Patient presents with  . Fall  . Dizziness    Douglas Roberts. is a 64 y.o. male.  The history is provided by the patient and the spouse.  Fall This is a new problem. The problem occurs constantly. The problem has been gradually improving. Associated symptoms include headaches. Pertinent negatives include no chest pain, no abdominal pain and no shortness of breath. The symptoms are aggravated by walking. The symptoms are relieved by rest.  Dizziness Associated symptoms: headaches   Associated symptoms: no chest pain and no shortness of breath   Patient with history of hypertension hyperlipidemia presents after fall.  Patient reports he was getting out of bed to go the bathroom when he slipped and fell.  He reports he did hit his head, but does not think he lost consciousness.  He reports mild headache.  No vomiting.  No new neck or back pain.  No chest pain or shortness of breath.  No focal weakness.  Patient reports after the fall he began having dizziness and he was unable to stand.  Wife attempted to help the patient but he still could not get up.  EMS was called to assist him and they brought him to the ER. Patient does admit to drinking gin tonight. No new medications.  He had otherwise been at his baseline without any difficulties.     Past Medical History:  Diagnosis Date  . Hyperlipemia   . Hypertension   . Sleep apnea    no CPAP    Patient Active Problem List   Diagnosis Date Noted  . Intractable low back pain 10/19/2017  . Lumbar stenosis with neurogenic claudication 10/15/2017    Past Surgical History:  Procedure Laterality Date  . ADENOIDECTOMY    . ANTERIOR LAT LUMBAR FUSION N/A 11/11/2018   Procedure: Lumbar Two-Three, Lumbar Three-Four, Lumbar Four-Five Anterolateral decompression/interbody fusion with  posterior percutaneous arthrodesis Lumbar Two to Lumbar Five with Mazor;  Surgeon: Kristeen Miss, MD;  Location: [facility];  Service: Neurosurgery;  Laterality: N/A;  Four-Five Anterolateral decompression/interbody fusion with posterior percutaneous arthrodesis Lumbar Two to Lumbar Five wi  . APPLICATION OF ROBOTIC ASSISTANCE FOR SPINAL PROCEDURE N/A 11/11/2018   Procedure: APPLICATION OF ROBOTIC ASSISTANCE FOR SPINAL PROCEDURE;  Surgeon: Kristeen Miss, MD;  Location: [facility];  Service: Neurosurgery;  Laterality: N/A;  APPLICATION OF ROBOTIC ASSISTANCE FOR SPINAL PROCEDURE  . COLONOSCOPY     x2  . LUMBAR LAMINECTOMY/DECOMPRESSION MICRODISCECTOMY Bilateral 10/15/2017   Procedure: Bilateral Lumbar Two- Three Lumbar Three- Four Lumbar Four- Five Laminectomy/Foraminotomy;  Surgeon: Kristeen Miss, MD;  Location: [facility];  Service: Neurosurgery;  Laterality: Bilateral;  Bilateral L2-3 L3-4 L4-5 Laminectomy/Foraminotomy  . LUMBAR PERCUTANEOUS PEDICLE SCREW 3 LEVEL N/A 11/11/2018   Procedure: Lumbar Two-Three, Lumbar Three-Four, Lumbar Four-Five Lumbar Percutaneous Pedicle Screw;  Surgeon: Kristeen Miss, MD;  Location: [facility];  Service: Neurosurgery;  Laterality: N/A;  Lumbar Two-Three, Lumbar Three-Four, Lumbar Four-Five Lumbar Percutaneous Pedicle Screw        Family History  Problem Relation Age of Onset  . Hypertension Mother   . Dementia Mother     Social History   Tobacco Use  . Smoking status: Never Smoker  . Smokeless tobacco: Never Used  Substance Use Topics  . Alcohol use: Yes    Comment: 2-3 drinks/week  . Drug use: No  Home Medications Prior to Admission medications   Medication Sig Start Date End Date Taking? Authorizing Provider  atenolol (TENORMIN) 25 MG tablet Take 25 mg by mouth daily at 6 PM.     [provider]  carisoprodol (SOMA) 350 MG tablet Take 1 tablet (350 mg total) by mouth 3 (three) times daily as needed for muscle spasms (pain). 11/16/18   Barnett Abu, MD    celecoxib (CELEBREX) 200 MG capsule Take 1 capsule (200 mg total) 2 (two) times daily by mouth. Patient not taking: Reported on 10/29/2018 10/23/17   Barnett Abu, MD  docusate sodium (COLACE) 100 MG capsule Take 100 mg by mouth daily at 6 (six) AM.    [provider]  doxazosin (CARDURA) 2 MG tablet Take 2 mg by mouth daily at 6 PM. 08/12/17   [provider]  Fiber CAPS Take 2 capsules by mouth 2 (two) times daily.     [provider]  fish oil-omega-3 fatty acids 1000 MG capsule Take 2 g by mouth daily at 6 PM.     [provider]  gabapentin (NEURONTIN) 300 MG capsule Take 1 capsule (300 mg total) 2 (two) times daily by mouth. Patient not taking: Reported on 10/29/2018 10/23/17   Barnett Abu, MD  HYDROmorphone (DILAUDID) 4 MG tablet 1 or 2 tablets every 4 hours as needed for pain control 11/16/18   Barnett Abu, MD  ibuprofen (ADVIL,MOTRIN) 200 MG tablet Take 400 mg by mouth 2 (two) times daily as needed (for pain.).     [provider]  Menthol, Topical Analgesic, (ICY HOT EX) Apply 1 application topically daily as needed (pain).    [provider]  methocarbamol (ROBAXIN) 500 MG tablet Take 1 tablet (500 mg total) by mouth every 6 (six) hours as needed for muscle spasms. 11/16/18   Barnett Abu, MD  pravastatin (PRAVACHOL) 40 MG tablet Take 40 mg by mouth daily at 6 PM.     [provider]  Probiotic Product (PROBIOTIC PO) Take 1 capsule by mouth daily at 6 (six) AM.    [provider]  Saw Palmetto 450 MG CAPS Take 450 mg by mouth daily at 6 PM.    [provider]  sodium chloride (OCEAN) 0.65 % SOLN nasal spray Place 1 spray into both nostrils 3 (three) times daily.    [provider]    Allergies    Bee venom  Review of Systems   Review of Systems  Constitutional: Negative for fever.  Eyes: Negative for visual disturbance.  Respiratory: Negative for shortness of breath.   Cardiovascular:  Negative for chest pain.  Gastrointestinal: Negative for abdominal pain.  Neurological: Positive for dizziness and headaches. Negative for syncope and numbness.  All other systems reviewed and are negative.   Physical Exam Updated Vital Signs BP 107/64   Pulse 67   Temp 98.1 F (36.7 C) (Oral)   Resp 10   SpO2 99%   Physical Exam CONSTITUTIONAL: Well developed/well nourished HEAD: Normocephalic/atraumatic, no signs of trauma EYES: EOMI/PERRL, no nystagmus, no visual field deficit  no ptosis ENMT: Mucous membranes moist NECK: supple no meningeal signs CV: S1/S2 noted, no murmurs/rubs/gallops noted LUNGS: Lungs are clear to auscultation bilaterally, no apparent distress ABDOMEN: soft, nontender, no rebound or guarding GU:no cva tenderness NEURO:Awake/alert, face symmetric, no arm or leg drift is noted Equal 5/5 strength with shoulder abduction, elbow flex/extension Equal 5/5 strength with hip flexion,knee flex/extension, foot dorsi/plantar flexion Cranial nerves 3/4/5/6/06/24/09/11/12 tested and intact  No past pointing Sensation to light touch intact in all extremities EXTREMITIES: pulses normal, full ROM, all extremities/joints palpated/ranged and nontender SKIN: warm, color normal PSYCH: no abnormalities of mood noted  ED Results / Procedures / Treatments   Labs (all labs ordered are listed, but only abnormal results are displayed) Labs Reviewed  BASIC METABOLIC PANEL - Abnormal; Notable for the following components:      Result Value   BUN 7 (*)    Anion gap 16 (*)    All other components within normal limits  CBC WITH DIFFERENTIAL/PLATELET - Abnormal; Notable for the following components:   MCV 100.7 (*)    All other components within normal limits  ETHANOL - Abnormal; Notable for the following components:   Alcohol, Ethyl (B) 256 (*)    All other components within normal limits    EKG EKG Interpretation  Date/Time:  Wednesday May 04 2020 00:41:11  EDT Ventricular Rate:  69 PR Interval:    QRS Duration: 144 QT Interval:  426 QTC Calculation: 457 R Axis:   62 Text Interpretation: Sinus rhythm Prolonged PR interval Nonspecific intraventricular conduction delay Confirmed by Zadie Rhine ([id number]) on 05/04/2020 12:45:22 AM   Radiology CT Head Wo Contrast  Result Date: 05/04/2020 CLINICAL DATA:  Dizziness EXAM: CT HEAD WITHOUT CONTRAST TECHNIQUE: Contiguous axial images were obtained from the base of the skull through the vertex without intravenous contrast. COMPARISON:  None. FINDINGS: Brain: No acute territorial infarction, hemorrhage or intracranial mass. The ventricles are nonenlarged. Vascular: No hyperdense vessels. Scattered carotid vascular calcification Skull: Normal. Negative for fracture or focal lesion. Sinuses/Orbits: Mild mucosal thickening in the sinuses Other: None IMPRESSION: Negative non contrasted CT appearance of the brain Electronically Signed   By: Jasmine Pang M.D.   On: 05/04/2020 01:34    Procedures Procedures   Medications Ordered in ED Medications  sodium chloride 0.9 % bolus 1,000 mL (0 mLs Intravenous Stopped 05/04/20 0343)  lactated ringers bolus 1,000 mL (1,000 mLs Intravenous New Bag/Given 05/04/20 0355)    ED Course  I have reviewed the triage vital signs and the nursing notes.  Pertinent labs & imaging results that were available during my care of the patient were reviewed by me and considered in my medical decision making (see chart for details).    MDM Rules/Calculators/A&P                     2:10 AM Patient presents after mechanical fall at home.  He reports he slipped walking to the bathroom but then was unable to get up.  He does admit to drinking gin tonight which is a nightly occurrence. Patient is not appear intoxicated.  No focal neuro deficits.  He did hit his head and has a mild headache, will proceed with CT head.4:54 AM overall patient is improved.  He ambulated and he reports  feeling improved.  Nurse tech reports unsteady gait the patient feels that he is improved and would like to be discharged Suspect this is combination of dehydration, alcohol abuse, patient reports history of orthostatic hypotension.  He has responded to fluids Plan to discharge home.   This patient presents to the ED for concern of fall and dizziness, this involves an extensive number of treatment options, and is a complaint that carries with it a high risk of complications and morbidity.  The differential diagnosis includes head injury, concussion, intracranial hemorrhage, electrolyte abnormality, acute coronary syndrome   Lab Tests:   I Ordered, reviewed, and  interpreted labs, which included metabolic panel, complete blood count  Medicines ordered:  I ordered medication IV fluids Imaging Studies ordered:   I ordered imaging studies which included CT head   I independently visualized and interpreted imaging which showed no acute findings  Additional history obtained:   Additional history obtained from wife   Reevaluation:  After the interventions stated above, I reevaluated the patient and found patient is improved  Final Clinical Impression(s) / ED Diagnoses Final diagnoses:  Fall, initial encounter  Concussion without loss of consciousness, initial encounter  Orthostatic hypotension    Rx / DC Orders ED Discharge Orders    None       Zadie Rhine, MD 05/04/20 [phone]

## 2020-05-04 NOTE — ED Triage Notes (Signed)
Pt BIB [facility] from home, pt states he got of bed and fell hitting his head on the floor. Denies LOC, no dizziness prior to the fall, but dizziness after. Denies nausea/vomiting. GCS 15. EMS VS: BP 130/80, HR 70, CBG 84

## 2020-05-04 NOTE — ED Notes (Signed)
Pt ambulated with unsteady gait in hall.

## 2020-06-14 ENCOUNTER — Ambulatory Visit: Payer: BC Managed Care – PPO | Admitting: Neurology

## 2020-06-14 ENCOUNTER — Encounter: Payer: Self-pay | Admitting: Neurology

## 2020-06-14 VITALS — BP 115/76 | HR 94 | Ht 74.0 in | Wt 234.3 lb

## 2020-06-14 DIAGNOSIS — R202 Paresthesia of skin: Secondary | ICD-10-CM

## 2020-06-14 DIAGNOSIS — G629 Polyneuropathy, unspecified: Secondary | ICD-10-CM

## 2020-06-14 DIAGNOSIS — R200 Anesthesia of skin: Secondary | ICD-10-CM | POA: Diagnosis not present

## 2020-06-14 NOTE — Patient Instructions (Signed)
You may have a condition called peripheral neuropathy, i. e. nerve damage. There is no specific treatment for most neuropathies. The most common cause for neuropathy is diabetes in [place], in which case, tight glucose control is key. Other causes include thyroid disease, and some vitamin deficiencies, including B12 deficiency; your B12 level was indeed low at one point.  Certain medications such as chemotherapy agents and other chemicals or toxins including alcohol can cause neuropathy. There are some genetic conditions or hereditary neuropathies. Typically patients will report a family history of neuropathy in those conditions. There are cases associated with cancers and autoimmune conditions. Most neuropathies are progressive unless a root cause can be found and treated. For most neuropathies there is no actual cure or reversing of symptoms. Painful neuropathy can be difficult to treat symptomatically, but there are some medications available to ease the symptoms. I would recommend you continue with the gabapentin for now.   We will check blood work today and call you with the test results.  Electrophysiologic testing with nerve conduction velocity studies and EMG (muscle testing) do not always pick up neuropathies that affect the smallest fibers.  I recommend the EMG and nerve conduction study, please call us back if you change your mind.   As discussed, please reduce your daily alcohol intake. Please try to hydrate better with water, 6 to 8 cups are recommended daily, 8 oz each.   I will see you back if needed.

## 2020-06-14 NOTE — Progress Notes (Signed)
Subjective:    Patient ID: Douglas Roberts. is a 64 y.o. male.  HPI     Huston Foley, MD, PhD [facility] [address], [address] [address] [place], [place] [address]  Dear Dr. Danielle Dess,   I saw your patient, Douglas Roberts, upon your kind request, in my Neurologic clinic today for initial consultation of his numbness and tingling affecting his lower extremities, concern for peripheral neuropathy.  The patient is unaccompanied today.  As you know, Douglas Roberts is a 64 year old right-handed gentleman with an underlying medical history of B12 deficiency, low back pain with status post surgery, hypertension, hyperlipidemia, sleep apnea, who reports paresthesias affecting his lower extremities particularly the soles of his feet, for the past 1 year approximately.  He does not actually feel any pain such as stinging or burning, just at times a discomfort and sometimes it just feels like a spongy sensation.  He has degenerative spine disease and midline low back pain, not severe, typically does not radiate to his legs, has been on gabapentin for this but does not think that tingling or paresthesias are particularly responsive to the gabapentin at this time. He fell last month, he had a ER visit at the time.  He reports feeling off balance at times.  He had an elevated alcohol level at the time.  He admitted to drinking gin.  Head CT was negative for any acute findings.  I reviewed your office records including your office visit from 05/06/2019. He had a Lumbar spine MRI with and without contrast through your office on 03/30/2019 and I reviewed the results: Impression: Interval L2-L5 fusion with only mild residual lateral recess and neural foraminal narrowing.  New L5-S1 disc protrusion resulting in right lateral recess stenosis and right S1 nerve root impingement.  Large right far lateral disc osteophyte complex at L1-2 with at most minimal change.  He was noted to have a low vitamin  B12 level at 202. He has been on gabapentin. Reportedly, he had EMG nerve conduction velocity testing in 2018 which suggested neuropathy. He is not keen on getting another test done at this time.  He admits to drinking alcohol daily or nearly daily in the form of gin, 4 to 5 ounces per day.  He does not drink water on a regular basis, likes to drink decaf coffee and decaf sweetened tea, sweetened with sweetener, not sugar.  He has never been labeled prediabetic or diabetic.  He has no family history of neuropathy but father was diabetic.  He has been taking B12 supplements by mouth, never had B12 injections.  Last check for his B12 level was about 6 months ago and was better per patient's report.  His Past Medical History Is Significant For: Past Medical History:  Diagnosis Date  . Hyperlipemia   . Hypertension   . Sleep apnea    no CPAP    His Past Surgical History Is Significant For: Past Surgical History:  Procedure Laterality Date  . ADENOIDECTOMY    . ANTERIOR LAT LUMBAR FUSION N/A 11/11/2018   Procedure: Lumbar Two-Three, Lumbar Three-Four, Lumbar Four-Five Anterolateral decompression/interbody fusion with posterior percutaneous arthrodesis Lumbar Two to Lumbar Five with Mazor;  Surgeon: Barnett Abu, MD;  Location: [facility] OR;  Service: Neurosurgery;  Laterality: N/A;  Four-Five Anterolateral decompression/interbody fusion with posterior percutaneous arthrodesis Lumbar Two to Lumbar Five wi  . APPLICATION OF ROBOTIC ASSISTANCE FOR SPINAL PROCEDURE N/A 11/11/2018   Procedure: APPLICATION OF ROBOTIC ASSISTANCE FOR SPINAL PROCEDURE;  Surgeon: Barnett AbuElsner, Henry, MD;  [facility];  Service: Neurosurgery;  Laterality: N/A;  APPLICATION OF ROBOTIC ASSISTANCE FOR SPINAL PROCEDURE  . COLONOSCOPY     x2  . LUMBAR LAMINECTOMY/DECOMPRESSION MICRODISCECTOMY Bilateral 10/15/2017   Procedure: Bilateral Lumbar Two- Three Lumbar Three- Four Lumbar Four- Five Laminectomy/Foraminotomy;  Surgeon: Barnett AbuElsner,  Henry, MD;  Location: [facility] OR;  Service: Neurosurgery;  Laterality: Bilateral;  Bilateral L2-3 L3-4 L4-5 Laminectomy/Foraminotomy  . LUMBAR PERCUTANEOUS PEDICLE SCREW 3 LEVEL N/A 11/11/2018   Procedure: Lumbar Two-Three, Lumbar Three-Four, Lumbar Four-Five Lumbar Percutaneous Pedicle Screw;  Surgeon: Barnett AbuElsner, Henry, MD;  Location: [facility] OR;  Service: Neurosurgery;  Laterality: N/A;  Lumbar Two-Three, Lumbar Three-Four, Lumbar Four-Five Lumbar Percutaneous Pedicle Screw     His Family History Is Significant For: Family History  Problem Relation Age of Onset  . Hypertension Mother   . Dementia Mother     His Social History Is Significant For: Social History   Socioeconomic History  . Marital status: Married    Spouse name: Not on file  . Number of children: Not on file  . Years of education: Not on file  . Highest education level: Not on file  Occupational History  . Not on file  Tobacco Use  . Smoking status: Never Smoker  . Smokeless tobacco: Never Used  Vaping Use  . Vaping Use: Never used  Substance and Sexual Activity  . Alcohol use: Yes    Comment: 2-3 drinks/week  . Drug use: No  . Sexual activity: Not on file  Other Topics Concern  . Not on file  Social History Narrative  . Not on file   Social Determinants of Health   Financial Resource Strain:   . Difficulty of Paying Living Expenses:   Food Insecurity:   . Worried About Programme researcher, broadcasting/film/videounning Out of Food in the Last Year:   . Baristaan Out of Food in the Last Year:   Transportation Needs:   . Freight forwarderLack of Transportation (Medical):   Marland Kitchen. Lack of Transportation (Non-Medical):   Physical Activity:   . Days of Exercise per Week:   . Minutes of Exercise per Session:   Stress:   . Feeling of Stress :   Social Connections:   . Frequency of Communication with Friends and Family:   . Frequency of Social Gatherings with Friends and Family:   . Attends Religious Services:   . Active Member of Clubs or Organizations:   . Attends Tax inspectorClub or  Organization Meetings:   Marland Kitchen. Marital Status:     His Allergies Are:  Allergies  Allergen Reactions  . Bee Venom Anaphylaxis  :   His Current Medications Are:  Outpatient Encounter Medications as of 06/14/2020  Medication Sig  . atenolol (TENORMIN) 25 MG tablet Take 25 mg by mouth every evening.   . Cyanocobalamin (VITAMIN B 12 PO) Take by mouth.  . docusate sodium (COLACE) 100 MG capsule Take 100 mg by mouth daily.   Marland Kitchen. doxazosin (CARDURA) 2 MG tablet Take 2 mg by mouth daily at 6 PM.  . Fiber CAPS Take 2 capsules by mouth 2 (two) times daily.   . fish oil-omega-3 fatty acids 1000 MG capsule Take 2 g by mouth daily at 6 PM.   . gabapentin (NEURONTIN) 600 MG tablet Take 600 mg by mouth at bedtime.  Marland Kitchen. ibuprofen (ADVIL,MOTRIN) 200 MG tablet Take 400 mg by mouth 2 (two) times daily as needed (for pain.).   Marland Kitchen. pravastatin (PRAVACHOL) 40 MG tablet Take 40 mg by mouth  daily at 6 PM.   . Probiotic Product (PROBIOTIC PO) Take 1 capsule by mouth daily at 6 (six) AM.  . Saw Palmetto 450 MG CAPS Take 450 mg by mouth daily at 6 PM.  . [DISCONTINUED] gabapentin (NEURONTIN) 300 MG capsule Take 1 capsule (300 mg total) 2 (two) times daily by mouth. (Patient not taking: Reported on 06/14/2020)  . [DISCONTINUED] methocarbamol (ROBAXIN) 500 MG tablet Take 1 tablet (500 mg total) by mouth every 6 (six) hours as needed for muscle spasms. (Patient not taking: Reported on 06/14/2020)   No facility-administered encounter medications on file as of 06/14/2020.  :   Review of Systems:  Out of a complete 14 point review of systems, all are reviewed and negative with the exception of these symptoms as listed below:  Review of Systems  Neurological:       Here to discuss worsening neuropathy in bilateral feet. Currently taking gabapentin 600 mg at bedtime but does not feel like this med is offering benefit. He also reports decrease in balance. 1 fall last month no significant injuries.     Objective:  Neurological  Exam  Physical Exam Physical Examination:   Vitals:   06/14/20 1427  BP: 115/76  Pulse: 94    General Examination: The patient is a very pleasant 64 y.o. male in no acute distress. He appears well-developed and well-nourished and well groomed.   HEENT: Normocephalic, atraumatic, pupils are equal, round and reactive to light and accommodation.  Corrective eyeglasses in place.  Extraocular tracking is well preserved.  Hearing is grossly intact.  Face is symmetric with normal facial animation and normal sensation.  There is no hypophonia, no voice tremor, no dysarthria.  Airway examination reveals moderate mouth dryness, tongue protrudes centrally in palate elevates symmetrically.  No carotid bruits.    Chest: Clear to auscultation without wheezing, rhonchi or crackles noted.  Heart: S1+S2+0, regular and normal without murmurs, rubs or gallops noted.   Abdomen: Soft, non-tender and non-distended with normal bowel sounds appreciated on auscultation.  Extremities: There is trace pitting edema in the distal lower extremities bilaterally. Pedal pulses are intact.  Skin: Warm and dry without trophic changes noted.  Musculoskeletal: exam reveals midline low back pain.  Neurologically:  Mental status: The patient is awake, alert and oriented in all 4 spheres. His immediate and remote memory, attention, language skills and fund of knowledge are appropriate. There is no evidence of aphasia, agnosia, apraxia or anomia. Speech is clear with normal prosody and enunciation. Thought process is linear. Mood is normal and affect is normal.  Cranial nerves II - XII are as described above under HEENT exam. In addition: shoulder shrug is normal with equal shoulder height noted. Motor exam: Normal bulk, strength and tone is noted. There is no drift, tremor or rebound. Romberg is positive for sway.  Reflexes are 1+ in the upper extremities and absent in the lower extremities.  Babinski: Toes are flexor  bilaterally. Fine motor skills and coordination: intact with normal finger taps, normal hand movements, normal rapid alternating patting, normal foot taps and normal foot agility.  Cerebellar testing: No dysmetria or intention tremor on finger to nose testing. Heel to shin is unremarkable bilaterally. There is no truncal or gait ataxia.  Sensory exam: intact to light touch, pinprick, vibration, temperature sense in the upper extremities but decreased to temperature and vibration sense in the distal lower extremities, mostly below the ankles.   Gait, station and balance: He stands up without difficulty, posture  is age-appropriate.  He may have a slight increase in forward tilt, loss of lumbar lordosis.  He walks without a limp, tandem walk is rather difficult for him.   Assessment and Plan:    In summary, Fabrizio Filip. is a very pleasant 64 y.o.-year old male with an underlying medical history of B12 deficiency, low back pain with status post surgery, hypertension, hyperlipidemia, sleep apnea, who presents for evaluation of his numbness and tingling of the lower extremities.  History and examination are indeed concerning for peripheral neuropathy.  He has had B12 deficiency which can in part explain his findings and presentation.  He is encouraged to continue with B12 supplementation, we also talked about other treatable causes of neuropathy.  Unfortunately, there is no reversing of symptoms typically, he has had balance issues and has fallen in the recent past.  He is advised to refrain from drinking alcohol daily and stay better hydrated with water.  Furthermore, would like to evaluate him with blood work today as well as EMG nerve conduction velocity testing.  He had EMG/NCV in or around 2018.  He is currently not in favor of doing another test.  I do not have those test results available for my review today.  He was suspected to have neuropathy back then.  He is agreeable to proceeding with blood  work.  For symptomatic treatment, he has been on gabapentin and is advised that it is typically helpful for painful sensation such as burning and stinging sensations, he may continue to have paresthesias even with taking symptomatic medication such as gabapentin.  An alternative may be Lyrica but I would not favor this quite yet especially in light of ankle swelling noted.  Lyrica can cause fluid retention.  He is advised that we will keep him posted as to his blood test results and follow-up if needed.  If he changes his mind regarding the EMG and nerve conduction velocity testing through our office he is encouraged to call us anytime.  I answered all his questions today and he was in agreement with the plan.   Thank you very much for allowing me to participate in the care of this nice patient. If I can be of any further assistance to you please do not hesitate to call me at [phone].  Sincerely,   Huston Foley, MD, PhD

## 2020-06-16 NOTE — Progress Notes (Signed)
Please call patient and advise him that his labs were unremarkable with the exception of slightly lower folate level.  He can look for a supplement over-the-counter like a multivitamin with folate.  2 of the results are still pending, we will update if abnormal next week.

## 2020-06-21 ENCOUNTER — Telehealth: Payer: Self-pay

## 2020-06-21 LAB — MULTIPLE MYELOMA PANEL, SERUM
Albumin SerPl Elph-Mcnc: 4.1 g/dL (ref 2.9–4.4)
Albumin/Glob SerPl: 1.8 — ABNORMAL HIGH (ref 0.7–1.7)
Alpha 1: 0.3 g/dL (ref 0.0–0.4)
Alpha2 Glob SerPl Elph-Mcnc: 0.6 g/dL (ref 0.4–1.0)
B-Globulin SerPl Elph-Mcnc: 0.9 g/dL (ref 0.7–1.3)
Gamma Glob SerPl Elph-Mcnc: 0.6 g/dL (ref 0.4–1.8)
Globulin, Total: 2.3 g/dL (ref 2.2–3.9)
IgA/Immunoglobulin A, Serum: 80 mg/dL (ref 61–437)
IgG (Immunoglobin G), Serum: 678 mg/dL (ref 603–1613)
IgM (Immunoglobulin M), Srm: 23 mg/dL (ref 20–172)
Total Protein: 6.4 g/dL (ref 6.0–8.5)

## 2020-06-21 LAB — VITAMIN B1: Thiamine: 76.7 nmol/L (ref 66.5–200.0)

## 2020-06-21 LAB — VITAMIN B6: Vitamin B6: 12.4 ug/L (ref 5.3–46.7)

## 2020-06-21 LAB — HEAVY METALS PROFILE II, BLOOD
Arsenic: 8 ug/L (ref 2–23)
Cadmium: <0.5 ug/L (ref 0.0–1.2)
Lead, Blood: <1 ug/dL (ref 0–4)
Mercury: <1 ug/L (ref 0.0–14.9)

## 2020-06-21 LAB — HGB A1C W/O EAG: Hgb A1c MFr Bld: 4.8 % (ref 4.8–5.6)

## 2020-06-21 LAB — B12 AND FOLATE PANEL
Folate: 3 ng/mL — ABNORMAL LOW (ref >3.0)
Vitamin B-12: 705 pg/mL (ref 232–1245)

## 2020-06-21 LAB — RHEUMATOID FACTOR: Rheumatoid fact SerPl-aCnc: <10 IU/mL (ref 0.0–13.9)

## 2020-06-21 LAB — RPR: RPR Ser Ql: NONREACTIVE

## 2020-06-21 LAB — TSH: TSH: 1.72 u[IU]/mL (ref 0.450–4.500)

## 2020-06-21 LAB — ANA W/REFLEX: Anti Nuclear Antibody (ANA): NEGATIVE

## 2020-06-21 LAB — C-REACTIVE PROTEIN: CRP: 2 mg/L (ref 0–10)

## 2020-06-21 NOTE — Telephone Encounter (Signed)
I called pt. No answer, left a message asking pt to call me back.   

## 2020-06-21 NOTE — Telephone Encounter (Signed)
-----   Message from Huston Foley, MD sent at 06/16/2020  4:39 PM EDT ----- Please call patient and advise him that his labs were unremarkable with the exception of slightly lower folate level.  He can look for a supplement over-the-counter like a multivitamin with folate.  2 of the results are still pending, we will update if abnormal next week.

## 2020-06-22 NOTE — Telephone Encounter (Signed)
I called pt. No answer, left a message asking pt to call me back.   

## 2020-06-23 NOTE — Telephone Encounter (Signed)
Left 3rd message asking pt to call back to discuss labs. Wife is not listed on the DPR so pt needs to call back.  Letter mailed to the patient asking for a call back, will wait to hear back from the pt.

## 2020-06-30 NOTE — Telephone Encounter (Signed)
Pt called me back and we were able to discuss labs. He verbalized understanding and had no questions/concerns at the time of the call.

## 2021-01-04 ENCOUNTER — Telehealth: Payer: Self-pay | Admitting: Neurology

## 2021-01-04 NOTE — Telephone Encounter (Signed)
This patient is requesting a provider switch. He is being referred to as by Dr. Danielle Dess for peripheral neuropathy and was seen and evaluated for this in June of 2021. Please advise if this is acceptable. Thank you

## 2021-01-04 NOTE — Telephone Encounter (Signed)
Okay with me 

## 2021-01-04 NOTE — Telephone Encounter (Signed)
Ok to be seen by another provider - should be one who does EMG.

## 2021-03-15 ENCOUNTER — Ambulatory Visit: Payer: Medicare PPO | Admitting: Neurology

## 2021-03-15 ENCOUNTER — Encounter: Payer: Self-pay | Admitting: Neurology

## 2021-03-15 VITALS — BP 127/73 | HR 81 | Ht 74.0 in | Wt 224.0 lb

## 2021-03-15 DIAGNOSIS — Z981 Arthrodesis status: Secondary | ICD-10-CM

## 2021-03-15 DIAGNOSIS — R200 Anesthesia of skin: Secondary | ICD-10-CM | POA: Diagnosis not present

## 2021-03-15 DIAGNOSIS — R202 Paresthesia of skin: Secondary | ICD-10-CM

## 2021-03-15 DIAGNOSIS — M5127 Other intervertebral disc displacement, lumbosacral region: Secondary | ICD-10-CM | POA: Insufficient documentation

## 2021-03-15 DIAGNOSIS — G629 Polyneuropathy, unspecified: Secondary | ICD-10-CM | POA: Diagnosis not present

## 2021-03-15 DIAGNOSIS — M5126 Other intervertebral disc displacement, lumbar region: Secondary | ICD-10-CM

## 2021-03-15 NOTE — Progress Notes (Signed)
[facility]  PATIENT: Douglas Roberts. DOB: Mar 30, 1956  REFERRING DOCTOR OR PCP: Kristeen Miss, MD (neurosurgery) Hulan Fess, MD (PCP) SOURCE: Patient, note from Dr. Ellene Route  _________________________________   HISTORICAL  CHIEF COMPLAINT:  Chief Complaint  Patient presents with  . New Patient (Initial Visit)    RM 13, alone. Referral from [facility],MD for peripheral neuropathy.     HISTORY OF PRESENT ILLNESS:  I had the pleasure of seeing patient, Douglas Roberts, at [facility] for neurologic consultation regarding his foot numbness.  He is a 65 year old man who has had numbness in his feet x 5 years.   The numbness starts when standing or walking.   THe numbness is just in the toes and feet.   The sensation is tingling thart intensifies to be uncomfortable.    Pain quickly improves upon sitting.     He also feels his balance is reduced but he can shampoo hair/close eyes without holding on for support.   He does feel he needs a handrail going downstairs.  He denies weakness.    While sitting he has no symptoms.    He denies change in his bladder.    He does not have diabetes.  He denies dry mouth or dry eyes.   No history of autoimmune or neoplastic disorder.   No history of chemotherapy.   He has had surgery on his back in 2018 (multilevel laminotomies) and 2019 (fusion from L2-L5)   Imaging personally reviewed: MRI 03/30/2019 shows PLIF at L2-L5 with subsequent improvement of spinal stenosis at each of these levels.  However, there is a new disc protrusion to the right at L5S1 that could affect right S1.  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  At L3-L4 there is retrolisthesis and a disc protrusion more to the right with probable right L4 nerve root compression.  At L4-L5, there is a large  posterior synovial cyst and other degenerative change contributing to severe spinal stenosis.  There is bilateral foraminal and lateral recess stenosis that could affect the exiting and traversing nerve roots.  At L5-S1 there is mild retrolisthesis and other degenerative changes with some encroachment upon the S1 nerve root more than the L5 nerve roots  Laboratory tests from primary care shows normal B12.   REVIEW OF SYSTEMS: Constitutional: No fevers, chills, sweats, or change in appetite Eyes: No visual changes, double vision, eye pain Ear, nose and throat: No hearing loss, ear pain, nasal congestion, sore throat Cardiovascular: No chest pain, palpitations Respiratory: No shortness of breath at rest or with exertion.   No wheezes GastrointestinaI: No nausea, vomiting, diarrhea, abdominal pain, fecal incontinence Genitourinary: No dysuria, urinary retention or frequency.  No nocturia. Musculoskeletal: No neck pain, back pain Integumentary: No rash, pruritus, skin lesions Neurological: as above Psychiatric: No depression at this time.  No anxiety Endocrine: No palpitations, diaphoresis, change in appetite, change in weigh or increased thirst Hematologic/Lymphatic: No anemia, purpura, petechiae. Allergic/Immunologic: No itchy/runny eyes, nasal congestion, recent allergic reactions, rashes  ALLERGIES: Allergies  Allergen Reactions  . Bee Venom Anaphylaxis    HOME MEDICATIONS:  Current Outpatient Medications:  .  atenolol (TENORMIN) 25 MG tablet, Take 25 mg by mouth every evening. , Disp: , Rfl:  .  Cyanocobalamin (VITAMIN B 12 PO), Take by mouth., Disp: , Rfl:  .  docusate  sodium (COLACE) 100 MG capsule, Take 100 mg by mouth daily. , Disp: , Rfl:  .  doxazosin (CARDURA) 2 MG tablet, Take 2 mg by mouth daily at 6 PM., Disp: , Rfl:  .  Fiber CAPS, Take 3 capsules by mouth 2 (two) times daily., Disp: , Rfl:  .  fish oil-omega-3 fatty acids 1000 MG capsule, Take 2 g by mouth daily at  6 PM. , Disp: , Rfl:  .  ibuprofen (ADVIL,MOTRIN) 200 MG tablet, Take 400 mg by mouth as needed (for pain.)., Disp: , Rfl:  .  pravastatin (PRAVACHOL) 40 MG tablet, Take 40 mg by mouth daily at 6 PM. , Disp: , Rfl:  .  Probiotic Product (PROBIOTIC PO), Take 1 capsule by mouth daily at 6 (six) AM., Disp: , Rfl:  .  Saw Palmetto 450 MG CAPS, Take 450 mg by mouth daily at 6 PM., Disp: , Rfl:   PAST MEDICAL HISTORY: Past Medical History:  Diagnosis Date  . Hyperlipemia   . Hypertension   . Sleep apnea    no CPAP    PAST SURGICAL HISTORY: Past Surgical History:  Procedure Laterality Date  . ADENOIDECTOMY    . ANTERIOR LAT LUMBAR FUSION N/A 11/11/2018   Procedure: Lumbar Two-Three, Lumbar Three-Four, Lumbar Four-Five Anterolateral decompression/interbody fusion with posterior percutaneous arthrodesis Lumbar Two to Lumbar Five with Mazor;  Surgeon: Kristeen Miss, MD;  Location: [place];  Service: Neurosurgery;  Laterality: N/A;  Four-Five Anterolateral decompression/interbody fusion with posterior percutaneous arthrodesis Lumbar Two to Lumbar Five wi  . APPLICATION OF ROBOTIC ASSISTANCE FOR SPINAL PROCEDURE N/A 11/11/2018   Procedure: APPLICATION OF ROBOTIC ASSISTANCE FOR SPINAL PROCEDURE;  Surgeon: Kristeen Miss, MD;  Location: [place];  Service: Neurosurgery;  Laterality: N/A;  APPLICATION OF ROBOTIC ASSISTANCE FOR SPINAL PROCEDURE  . COLONOSCOPY     x2  . LUMBAR LAMINECTOMY/DECOMPRESSION MICRODISCECTOMY Bilateral 10/15/2017   Procedure: Bilateral Lumbar Two- Three Lumbar Three- Four Lumbar Four- Five Laminectomy/Foraminotomy;  Surgeon: Kristeen Miss, MD;  Location: [place];  Service: Neurosurgery;  Laterality: Bilateral;  Bilateral L2-3 L3-4 L4-5 Laminectomy/Foraminotomy  . LUMBAR PERCUTANEOUS PEDICLE SCREW 3 LEVEL N/A 11/11/2018   Procedure: Lumbar Two-Three, Lumbar Three-Four, Lumbar Four-Five Lumbar Percutaneous Pedicle Screw;  Surgeon: Kristeen Miss, MD;  Location: [place];  Service:  Neurosurgery;  Laterality: N/A;  Lumbar Two-Three, Lumbar Three-Four, Lumbar Four-Five Lumbar Percutaneous Pedicle Screw     FAMILY HISTORY: Family History  Problem Relation Age of Onset  . Hypertension Mother   . Dementia Mother   . Dementia Father   . Parkinson's disease Father     SOCIAL HISTORY:  Social History   Socioeconomic History  . Marital status: Married    Spouse name: Not on file  . Number of children: Not on file  . Years of education: Not on file  . Highest education level: Not on file  Occupational History  . Not on file  Tobacco Use  . Smoking status: Never Smoker  . Smokeless tobacco: Never Used  Vaping Use  . Vaping Use: Never used  Substance and Sexual Activity  . Alcohol use: Yes    Comment: 5oz per day  . Drug use: No  . Sexual activity: Not on file  Other Topics Concern  . Not on file  Social History Narrative   Right handed    Caffeine use: Tea sometimes, decaf coffee and tea. Rarely drinks soda   Social Determinants of Health   Financial Resource Strain: Not on file  Food  Insecurity: Not on file  Transportation Needs: Not on file  Physical Activity: Not on file  Stress: Not on file  Social Connections: Not on file  Intimate Partner Violence: Not on file     PHYSICAL EXAM  Vitals:   03/15/21 1008  BP: 127/73  Pulse: 81  Weight: 224 lb (101.6 kg)  Height: $Remove'6\' 2"'IngGYkn$  (1.88 m)    Body mass index is 28.76 kg/m.   General: The patient is well-developed and well-nourished and in no acute distress  HEENT:  Head is /AT.  Sclera are anicteric.  Funduscopic exam shows normal optic discs and retinal vessels.  Neck: No carotid bruits are noted.  The neck is nontender.  Cardiovascular: The heart has a regular rate and rhythm with a normal S1 and S2. There were no murmurs, gallops or rubs.    Skin: Extremities are without rash or  edema.  Musculoskeletal:  Back is nontender  Neurologic Exam  Mental status: The patient is alert and  oriented x 3 at the time of the examination. The patient has apparent normal recent and remote memory, with an apparently normal attention span and concentration ability.   Speech is normal.  Cranial nerves: Extraocular movements are full. Pupils are equal, round, and reactive to light and accomodation.  . There is good facial sensation to soft touch bilaterally.Facial strength is normal.  Trapezius and sternocleidomastoid strength is normal. No dysarthria is noted.  The tongue is midline, and the patient has symmetric elevation of the soft palate. No obvious hearing deficits are noted.  Motor:  Muscle bulk is normal.   Tone is normal. Strength is  5 / 5 in all 4 extremities.   Sensory: He has normal vibration sensation in arms and knees but reduced (10% right and 20% left) vibration sensation at ankles and toes (10%).   Pinprick reduced right S1 but normal elsewhere in feet.    Coordination: Cerebellar testing reveals good finger-nose-finger and heel-to-shin bilaterally.  Gait and station: Station is normal.   Gait is normal. Tandem gait is normal. Romberg is negative.   Reflexes: Deep tendon reflexes are symmetric and normal bilaterally.   Plantar responses are flexor.    DIAGNOSTIC DATA (LABS, IMAGING, TESTING) - I reviewed patient records, labs, notes, testing and imaging myself where available.  Lab Results  Component Value Date   WBC 6.3 05/04/2020   HGB 14.9 05/04/2020   HCT 45.2 05/04/2020   MCV 100.7 (H) 05/04/2020   PLT 184 05/04/2020      Component Value Date/Time   NA 140 05/04/2020 0144   K 4.6 05/04/2020 0144   CL 99 05/04/2020 0144   CO2 25 05/04/2020 0144   GLUCOSE 89 05/04/2020 0144   BUN 7 (L) 05/04/2020 0144   CREATININE 0.82 05/04/2020 0144   CALCIUM 9.5 05/04/2020 0144   PROT 6.4 06/14/2020 1551   GFRNONAA >60 05/04/2020 0144   GFRAA >60 05/04/2020 0144   No results found for: CHOL, HDL, LDLCALC, LDLDIRECT, TRIG, CHOLHDL Lab Results  Component Value  Date   HGBA1C 4.8 06/14/2020   Lab Results  Component Value Date   VITAMINB12 705 06/14/2020   Lab Results  Component Value Date   TSH 1.720 06/14/2020       ASSESSMENT AND PLAN  Polyneuropathy - Plan: Multiple Myeloma Panel (SPEP&IFE w/QIG), Sjogren's syndrome antibods(ssa + ssb), Rheumatoid factor, NCV with EMG(electromyography), Cryoglobulin  Numbness and tingling of both feet  History of lumbar fusion  Protrusion of lumbar intervertebral disc  In summary, Mr. Pasion is a 65 year old man with bilateral foot and ankle numbness.  There is superimposed right S1 distribution numbness.  He he has had multilevel fusion from L2-L5 and the most recent MRI shows a new disc protusion at L5-S1 that could affect the right S1 nerve root that was not present on the preoperative study.  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  I will check blood work for treatable causes of polyneuropathy.  Additionally we will check NCV/EMG study to further characterize.  Although he has some pain, this tends to be intermittent and I will hold off on medical therapy at this time.  We could reconsider a medication such as gabapentin or duloxetine if symptoms worsen.  I will see him when he returns for the NCV/EMG study.  He should call sooner if new or worsening neurologic symptoms.  Thank you for asking me to see Mr. Gorter.  Please let me know if I can be of further assistance with him or other patients in the future.   Berenise Hunton A. Felecia Shelling, MD, [facility] 1/51/7616, 0:73 PM Certified in Neurology, Clinical Neurophysiology, Sleep Medicine and Neuroimaging  [facility] [address] [place], [place] [address] [phone]

## 2021-03-16 LAB — MULTIPLE MYELOMA PANEL, SERUM

## 2021-03-20 LAB — MULTIPLE MYELOMA PANEL, SERUM
Albumin SerPl Elph-Mcnc: 4.3 g/dL (ref 2.9–4.4)
Albumin/Glob SerPl: 1.9 — ABNORMAL HIGH (ref 0.7–1.7)
Alpha 1: 0.2 g/dL (ref 0.0–0.4)
Alpha2 Glob SerPl Elph-Mcnc: 0.6 g/dL (ref 0.4–1.0)
B-Globulin SerPl Elph-Mcnc: 0.9 g/dL (ref 0.7–1.3)
Gamma Glob SerPl Elph-Mcnc: 0.5 g/dL (ref 0.4–1.8)
IgA/Immunoglobulin A, Serum: 89 mg/dL (ref 61–437)
IgG (Immunoglobin G), Serum: 700 mg/dL (ref 603–1613)
IgM (Immunoglobulin M), Srm: 32 mg/dL (ref 20–172)
Total Protein: 6.6 g/dL (ref 6.0–8.5)

## 2021-03-20 LAB — SJOGREN'S SYNDROME ANTIBODS(SSA + SSB)
ENA SSA (RO) Ab: <0.2 AI (ref 0.0–0.9)
ENA SSB (LA) Ab: <0.2 AI (ref 0.0–0.9)

## 2021-03-20 LAB — CRYOGLOBULIN

## 2021-03-20 LAB — RHEUMATOID FACTOR: Rheumatoid fact SerPl-aCnc: <10 IU/mL (ref <14.0)

## 2021-03-28 ENCOUNTER — Encounter: Payer: Medicare PPO | Admitting: Neurology

## 2021-04-04 ENCOUNTER — Ambulatory Visit (INDEPENDENT_AMBULATORY_CARE_PROVIDER_SITE_OTHER): Payer: Medicare PPO | Admitting: Neurology

## 2021-04-04 ENCOUNTER — Encounter: Payer: Medicare PPO | Admitting: Neurology

## 2021-04-04 ENCOUNTER — Telehealth: Payer: Self-pay | Admitting: Neurology

## 2021-04-04 DIAGNOSIS — G629 Polyneuropathy, unspecified: Secondary | ICD-10-CM

## 2021-04-04 DIAGNOSIS — Z0289 Encounter for other administrative examinations: Secondary | ICD-10-CM

## 2021-04-04 DIAGNOSIS — M48062 Spinal stenosis, lumbar region with neurogenic claudication: Secondary | ICD-10-CM | POA: Diagnosis not present

## 2021-04-04 DIAGNOSIS — M5127 Other intervertebral disc displacement, lumbosacral region: Secondary | ICD-10-CM

## 2021-04-04 NOTE — Telephone Encounter (Signed)
Pt is asking for a call to discuss the scheduling of his spinal tap

## 2021-04-04 NOTE — Progress Notes (Signed)
Full Name: Douglas Roberts Gender: Male MRN #: [id number] Date of Birth: 04-18-1956    Visit Date: 04/04/2021 08:52 Age: 65 Years Examining Physician: Despina Arias, MD  Referring Physician: Despina Arias, MD    History: He is a 65 year old man with progressive numbness starting in the feet 5 years ago.  Numbness now extends above the ankles.  He notes the numbness more when he is standing or walking.  It can be uncomfortable but not painful.  Discomfort improves after he sits.  He also notes balance is reduced especially if he closes his eyes.  On exam, he has 90% loss of vibration sensation at the ankles and toes.  Touch sensation is just minimally reduced.  Nerve conduction studies: The left ulnar motor response was normal.  The left median motor response had a normal distal latency, borderline normal conduction velocity and amplitude.  The right peroneal motor response was absent.  The left peroneal motor response had a markedly reduced amplitude with normal distal latency and moderately reduced conduction velocity.  Bilateral tibial motor responses had normal distal latency, reduced conduction velocity and reduced amplitudes.  The median and ulnar sensory responses had normal peak latency and reduced amplitudes.  The sural and superficial peroneal sensory responses were absent.  The galvanic sympathetic skin response was absent in the foot and present in the hand.   Electromyography: Needle EMG of selected muscles of the left arm and leg was performed.  In the left arm, there was mild chronic elevation of the extensor digitorum communis muscle.  Other muscles in the arm was normal.  The left tibialis anterior , gastrocnemius and abductor hallucis muscles showed mild acute denervation and moderate chronic denervation.  There was mild chronic denervation in the gluteus medius, peroneus longus and vastus medialis muscles and the iliopsoas was normal.  Impression: This NCV/EMG study  shows the following: 1.   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 2.   Left chronic S1 radiculopathy.  Jasmina Gendron A. Epimenio Foot, MD, PhD, FAAN Certified in Neurology, Clinical Neurophysiology, Sleep Medicine, Pain Medicine and Neuroimaging Director, [facility]  [facility] [address], [address] [place], [place] [address] [phone]     Verbal informed consent was obtained from the patient, patient was informed of potential risk of procedure, including bruising, bleeding, hematoma formation, infection, muscle weakness, muscle pain, numbness, among others.        MNC    Nerve / Sites Muscle Latency Ref. Amplitude Ref. Rel Amp Segments Distance Velocity Ref. Area    ms ms mV mV %  cm m/s m/s mVms  L Median - APB     Wrist APB 2.8 ?4.4 3.8 ?4.0 100 Wrist - APB 7   15.4     Upper arm APB 7.7  2.8  73.4 Upper arm - Wrist 24 49 ?49 10.7  L Ulnar - ADM     Wrist ADM 2.7 ?3.3 9.2 ?6.0 100 Wrist - ADM 7   34.9     B.Elbow ADM 6.4  9.1  98.8 B.Elbow - Wrist 20 53 ?49 37.5     A.Elbow ADM 8.4  8.8  96.9 A.Elbow - B.Elbow 10 49 ?49 38.1  R Peroneal - EDB     Ankle EDB NR ?6.5 NR ?2.0 NR Ankle - EDB 9   NR     Fib head EDB NR  NR  NR Fib head - Ankle  30 NR ?44 NR     Pop fossa EDB NR  NR  NR Pop fossa - Fib head 10 NR ?44 NR         Pop fossa - Ankle      L Peroneal - EDB     Ankle EDB 5.6 ?6.5 0.2 ?2.0 100 Ankle - EDB 9   0.5     Fib head EDB 15.9  0.2  103 Fib head - Ankle 30 29 ?44 0.5     Pop fossa EDB NR  NR  NR Pop fossa - Fib head 10 NR ?44 NR         Pop fossa - Ankle      R Tibial - AH     Ankle AH 5.6 ?5.8 0.8 ?4.0 100 Ankle - AH 9   2.6     Pop fossa AH 18.5  0.6  74 Pop fossa - Ankle 44 34 ?41 2.5  L Tibial - AH     Ankle AH 4.5 ?5.8 1.7 ?4.0 100 Ankle - AH 9   5.2     Pop fossa AH 17.4  0.8  47.8 Pop fossa - Ankle 44 34 ?41 1.8                 SSR    Nerve /  Sites Latency   s  L Sympathetic - Foot     Foot NR            SNC    Nerve / Sites Rec. Site Peak Lat Ref.  Amp Ref. Segments Distance    ms ms V V  cm  R Sural - Ankle (Calf)     Calf Ankle NR ?4.4 NR ?6 Calf - Ankle 14  L Sural - Ankle (Calf)     Calf Ankle NR ?4.4 NR ?6 Calf - Ankle 14  R Superficial peroneal - Ankle     Lat leg Ankle NR ?4.4 NR ?6 Lat leg - Ankle 14  L Superficial peroneal - Ankle     Lat leg Ankle NR ?4.4 NR ?6 Lat leg - Ankle 14  L Median - Orthodromic (Dig II, Mid palm)     Dig II Wrist 3.0 ?3.4 9 ?10 Dig II - Wrist 13  L Ulnar - Orthodromic, (Dig V, Mid palm)     Dig V Wrist 2.8 ?3.1 5 ?5 Dig V - Wrist 66                 F  Wave    Nerve F Lat Ref.   ms ms  R Tibial - AH 75.3 ?56.0  L Tibial - AH 77.8 ?56.0  L Ulnar - ADM 34.4 ?32.0           EMG Summary Table    Spontaneous MUAP Recruitment  Muscle IA Fib PSW Fasc Other Amp Dur. Poly Pattern  L. Vastus medialis Normal None None None _______ Normal Increased 1+ Reduced  L. Tibialis anterior Normal 1+ 1+ None _______ Increased Increased 3+ Discrete  L. Peroneus longus Normal None None None _______ Increased Increased 1+ Reduced  L. Gastrocnemius (Medial head) Normal 1+ None None _______ Increased Increased 2+ Discrete  L. Abductor hallucis Normal 1+ None None _______ Increased Increased Normal Discrete  L. Gluteus medius Normal None None None _______ Increased Increased 1+ Reduced  L. Iliopsoas Normal None None None _______ Normal Normal Normal Normal  L. Triceps brachii Normal None None None _______ Normal Normal  Normal Normal  L. Biceps brachii Normal None None None _______ Normal Normal Normal Normal  L. Extensor digitorum communis Normal None None None _______ Normal Increased 1+ Reduced  L. Flexor carpi ulnaris Normal None None None _______ Normal Normal 1+ Normal  L. First dorsal interosseous Normal None None None _______ Normal Normal Normal Normal        [facility]  PATIENT: Douglas DoomJoseph E Stell Jr. DOB: 05/05/1956  REFERRING DOCTOR OR PCP: Barnett AbuHenry Elsner, MD (neurosurgery) Catha GosselinKevin Little, MD (PCP) SOURCE: Patient, note from Dr. Danielle DessElsner  _________________________________   HISTORICAL  CHIEF COMPLAINT:  No chief complaint on file.   HISTORY OF PRESENT ILLNESS:  Addison BaileyJoseph Plack, is a 65 year old man with progressive foot and leg numbness.    Symptoms began about 5 years ago and have steadily progressed.. The numbness starts when standing or walking.   THe numbness is just in the toes and feet.   The sensation is tingling thart intensifies to be uncomfortable.    Pain quickly improves upon sitting.     He also feels his balance is reduced but he can shampoo hair/close eyes without holding on for support.   He does feel he needs a handrail going downstairs.  He denies weakness.    While sitting he has no symptoms.    He denies change in his bladder.    He does not have diabetes.  He denies dry mouth or dry eyes.   No history of autoimmune or neoplastic disorder.   No history of chemotherapy.   He has had surgery on his back in 2018 (multilevel laminotomies) and 2019 (fusion from L2-L5)  NCV/EMG study today shows a severe length dependent sensory and motor and autonomic polyneuropathy.  Features are demyelinating greater than axonal in the legs though the arms were fairly normal.  Imaging personally reviewed: MRI 03/30/2019 shows PLIF at L2-L5 with subsequent improvement of spinal stenosis at each of these levels.  However, there is a new disc protrusion to the right at L5S1 that could affect right S1.  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  At L3-L4 there is retrolisthesis and a disc protrusion more to the right with probable right L4 nerve root compression.  At L4-L5, there is a large posterior synovial cyst  and other degenerative change contributing to severe spinal stenosis.  There is bilateral foraminal and lateral recess stenosis that could affect the exiting and traversing nerve roots.  At L5-S1 there is mild retrolisthesis and other degenerative changes with some encroachment upon the S1 nerve root more than the L5 nerve roots  Laboratory tests from primary care shows normal B12.   REVIEW OF SYSTEMS: Constitutional: No fevers, chills, sweats, or change in appetite Eyes: No visual changes, double vision, eye pain Ear, nose and throat: No hearing loss, ear pain, nasal congestion, sore throat Cardiovascular: No chest pain, palpitations Respiratory: No shortness of breath at rest or with exertion.   No wheezes GastrointestinaI: No nausea, vomiting, diarrhea, abdominal pain, fecal incontinence Genitourinary: No dysuria, urinary retention or frequency.  No nocturia. Musculoskeletal: No neck pain, back pain Integumentary: No rash, pruritus, skin lesions Neurological: as above Psychiatric: No depression at this time.  No anxiety Endocrine: No palpitations, diaphoresis, change in appetite, change in weigh or increased thirst Hematologic/Lymphatic: No anemia, purpura, petechiae. Allergic/Immunologic: No itchy/runny eyes, nasal congestion, recent allergic  reactions, rashes  ALLERGIES: Allergies  Allergen Reactions  . Bee Venom Anaphylaxis    HOME MEDICATIONS:  Current Outpatient Medications:  .  atenolol (TENORMIN) 25 MG tablet, Take 25 mg by mouth every evening. , Disp: , Rfl:  .  Cyanocobalamin (VITAMIN B 12 PO), Take by mouth., Disp: , Rfl:  .  docusate sodium (COLACE) 100 MG capsule, Take 100 mg by mouth daily. , Disp: , Rfl:  .  doxazosin (CARDURA) 2 MG tablet, Take 2 mg by mouth daily at 6 PM., Disp: , Rfl:  .  Fiber CAPS, Take 3 capsules by mouth 2 (two) times daily., Disp: , Rfl:  .  fish oil-omega-3 fatty acids 1000 MG capsule, Take 2 g by mouth daily at 6 PM. , Disp: , Rfl:   .  ibuprofen (ADVIL,MOTRIN) 200 MG tablet, Take 400 mg by mouth as needed (for pain.)., Disp: , Rfl:  .  pravastatin (PRAVACHOL) 40 MG tablet, Take 40 mg by mouth daily at 6 PM. , Disp: , Rfl:  .  Probiotic Product (PROBIOTIC PO), Take 1 capsule by mouth daily at 6 (six) AM., Disp: , Rfl:  .  Saw Palmetto 450 MG CAPS, Take 450 mg by mouth daily at 6 PM., Disp: , Rfl:   PAST MEDICAL HISTORY: Past Medical History:  Diagnosis Date  . Hyperlipemia   . Hypertension   . Sleep apnea    no CPAP    PAST SURGICAL HISTORY: Past Surgical History:  Procedure Laterality Date  . ADENOIDECTOMY    . ANTERIOR LAT LUMBAR FUSION N/A 11/11/2018   Procedure: Lumbar Two-Three, Lumbar Three-Four, Lumbar Four-Five Anterolateral decompression/interbody fusion with posterior percutaneous arthrodesis Lumbar Two to Lumbar Five with Mazor;  Surgeon: Barnett Abu, MD;  Location: [facility] OR;  Service: Neurosurgery;  Laterality: N/A;  Four-Five Anterolateral decompression/interbody fusion with posterior percutaneous arthrodesis Lumbar Two to Lumbar Five wi  . APPLICATION OF ROBOTIC ASSISTANCE FOR SPINAL PROCEDURE N/A 11/11/2018   Procedure: APPLICATION OF ROBOTIC ASSISTANCE FOR SPINAL PROCEDURE;  Surgeon: Barnett Abu, MD;  Location: [facility] OR;  Service: Neurosurgery;  Laterality: N/A;  APPLICATION OF ROBOTIC ASSISTANCE FOR SPINAL PROCEDURE  . COLONOSCOPY     x2  . LUMBAR LAMINECTOMY/DECOMPRESSION MICRODISCECTOMY Bilateral 10/15/2017   Procedure: Bilateral Lumbar Two- Three Lumbar Three- Four Lumbar Four- Five Laminectomy/Foraminotomy;  Surgeon: Barnett Abu, MD;  Location: [facility] OR;  Service: Neurosurgery;  Laterality: Bilateral;  Bilateral L2-3 L3-4 L4-5 Laminectomy/Foraminotomy  . LUMBAR PERCUTANEOUS PEDICLE SCREW 3 LEVEL N/A 11/11/2018   Procedure: Lumbar Two-Three, Lumbar Three-Four, Lumbar Four-Five Lumbar Percutaneous Pedicle Screw;  Surgeon: Barnett Abu, MD;  Location: [facility] OR;  Service: Neurosurgery;  Laterality: N/A;   Lumbar Two-Three, Lumbar Three-Four, Lumbar Four-Five Lumbar Percutaneous Pedicle Screw     FAMILY HISTORY: Family History  Problem Relation Age of Onset  . Hypertension Mother   . Dementia Mother   . Dementia Father   . Parkinson's disease Father     SOCIAL HISTORY:  Social History   Socioeconomic History  . Marital status: Married    Spouse name: Not on file  . Number of children: Not on file  . Years of education: Not on file  . Highest education level: Not on file  Occupational History  . Not on file  Tobacco Use  . Smoking status: Never Smoker  . Smokeless tobacco: Never Used  Vaping Use  . Vaping Use: Never used  Substance and Sexual Activity  . Alcohol use: Yes    Comment: 5oz per day  .  Drug use: No  . Sexual activity: Not on file  Other Topics Concern  . Not on file  Social History Narrative   Right handed    Caffeine use: Tea sometimes, decaf coffee and tea. Rarely drinks soda   Social Determinants of Health   Financial Resource Strain: Not on file  Food Insecurity: Not on file  Transportation Needs: Not on file  Physical Activity: Not on file  Stress: Not on file  Social Connections: Not on file  Intimate Partner Violence: Not on file     PHYSICAL EXAM  There were no vitals filed for this visit.  There is no height or weight on file to calculate BMI.   General: The patient is well-developed and well-nourished and in no acute distress  HEENT:  Head is [place]/AT.  Sclera are anicteric.   thm with a normal S1 and S2. There were no murmurs, gallops or rubs.    Skin: Extremities are without rash or  edema.  Neurologic Exam  Mental status: The patient is alert and oriented x 3 at the time of the examination. The patient has apparent normal recent and remote memory, with an apparently normal attention span and concentration ability.   Speech is normal.  Cranial nerves: Extraocular movements are full.  Facial strength and sensation was  normal.  Motor:  Muscle bulk is normal.   Tone is normal. Strength is  5 / 5 in arms and proximal legs and 4/5 EHL, 4+/5 ankle extension.  Sensory: He has normal vibration sensation in arms and knees but reduced (10% right and 20% left) vibration sensation at ankles and toes (10%).   Pinprick reduced right S1 but normal elsewhere in feet.    Coordination: Cerebellar testing reveals good finger-nose-finger and heel-to-shin bilaterally.  Gait and station: Station is normal.   Gait is normal. Tandem gait is normal. Romberg is borderline.   Reflexes: Deep tendon reflexes are symmetric and normal bilaterally.        DIAGNOSTIC DATA (LABS, IMAGING, TESTING) - I reviewed patient records, labs, notes, testing and imaging myself where available.  Lab Results  Component Value Date   WBC 6.3 05/04/2020   HGB 14.9 05/04/2020   HCT 45.2 05/04/2020   MCV 100.7 (H) 05/04/2020   PLT 184 05/04/2020      Component Value Date/Time   NA 140 05/04/2020 0144   K 4.6 05/04/2020 0144   CL 99 05/04/2020 0144   CO2 25 05/04/2020 0144   GLUCOSE 89 05/04/2020 0144   BUN 7 (L) 05/04/2020 0144   CREATININE 0.82 05/04/2020 0144   CALCIUM 9.5 05/04/2020 0144   PROT 6.6 03/15/2021 1111   GFRNONAA >60 05/04/2020 0144   GFRAA >60 05/04/2020 0144   No results found for: CHOL, HDL, LDLCALC, LDLDIRECT, TRIG, CHOLHDL Lab Results  Component Value Date   HGBA1C 4.8 06/14/2020   Lab Results  Component Value Date   VITAMINB12 705 06/14/2020   Lab Results  Component Value Date   TSH 1.720 06/14/2020       ASSESSMENT AND PLAN  Polyneuropathy - Plan: NCV with EMG(electromyography)  1.   Lumbar puncture to determine if protein is elevated.  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 2.   Stay active and exercise as tolerated. 3.   We will set up follow-up evaluation after  the results of the LP.  He should call us  if he has new or worsening symptoms.   Jhoselyn Ruffini A. Epimenio Foot, MD, [facility] 04/04/2021, 12:57 PM Certified in Neurology, Clinical Neurophysiology, Sleep Medicine and Neuroimaging  [facility] [address] [place], [place] [address] [phone]

## 2021-04-04 NOTE — Telephone Encounter (Signed)
Dr. Epimenio Foot- please place order if you are still wanting LP, thank you LVM for pt letting him know once order placed, they will work on insurance auth and someone will reach out to get him scheduled in this next week. If he does not get called to be scheduled, he should let our office know. Advised him to call back if he has any further questions/concerns.

## 2021-04-05 NOTE — Telephone Encounter (Signed)
Douglas Roberts- Dr. Epimenio Foot placed LP order. Just wanted to make sure you could see the order, thanks

## 2021-04-06 ENCOUNTER — Other Ambulatory Visit: Payer: Self-pay

## 2021-04-06 ENCOUNTER — Ambulatory Visit
Admission: RE | Admit: 2021-04-06 | Discharge: 2021-04-06 | Disposition: A | Discharge status: Home / Self Care | Payer: Medicare PPO | Source: Ambulatory Visit | Attending: Neurology | Admitting: Neurology

## 2021-04-06 VITALS — BP 165/100 | HR 86

## 2021-04-06 DIAGNOSIS — G629 Polyneuropathy, unspecified: Secondary | ICD-10-CM

## 2021-04-06 NOTE — Discharge Instructions (Signed)

## 2021-04-10 LAB — CSF CELL COUNT WITH DIFFERENTIAL
Basophils, %: 0 %
Lymphs, CSF: 96 % — ABNORMAL HIGH (ref 40–80)
Monocyte/Macrophage: 4 % — ABNORMAL LOW (ref 15–45)
RBC Count, CSF: 41 cells/uL — ABNORMAL HIGH
Segmented Neutrophils-CSF: 0 % (ref 0–6)
WBC, CSF: 5 cells/uL (ref 0–5)

## 2021-04-10 LAB — PROTEIN, CSF: Total Protein, CSF: 70 mg/dL — ABNORMAL HIGH (ref 15–60)

## 2021-04-10 LAB — VDRL, CSF: VDRL Quant, CSF: NONREACTIVE

## 2021-04-10 LAB — GLUCOSE, CSF: Glucose, CSF: 42 mg/dL (ref 40–80)

## 2021-04-10 NOTE — Telephone Encounter (Signed)
Noted LP Done . I was out of the office .

## 2021-04-12 ENCOUNTER — Telehealth: Payer: Self-pay | Admitting: Neurology

## 2021-04-12 MED ORDER — PREDNISONE 20 MG PO TABS: 60.0000 mg | ORAL_TABLET | Freq: Every day | ORAL | 3 refills | Status: DC

## 2021-04-12 NOTE — Telephone Encounter (Signed)
I called twice - once I left a VM and the other the mailbox was full.  Please see if you can reach him - the protein was elevated in the CSF so I would like him to start :  prednisone 20 mg  #100  take 3 po qd  I would like to see him back in 3-4 weeks to see how he is doing and adjust the dose

## 2021-04-12 NOTE — Telephone Encounter (Signed)
Tried calling [phone]. Mailbox full, unable to leave voicemail. Called [phone]. Spoke w/ pt. Relayed Dr. Garth Bigness message. E-scribed prednisone to pharmacy. Scheduled follow up for 05/08/21 at 3pm with Dr. Felecia Shelling.   He was concerned about elevated value under multiple myeloma panel: Albumin/Glob SerPl 0.7 - 1.7 1.9High    Advised I will send to MD to further review. I will call back if he has anything more to add. Otherwise, he can speak with him further about this at follow up appt. He verbalized understanding.

## 2021-04-12 NOTE — Telephone Encounter (Signed)
Dr. Epimenio Foot- do you have LP lab results?

## 2021-04-12 NOTE — Telephone Encounter (Signed)
Pt called, have gotten my results on MyChart. Have not heard from the physician. Would like a call from the nurse.

## 2021-05-08 ENCOUNTER — Encounter: Payer: Self-pay | Admitting: Neurology

## 2021-05-08 ENCOUNTER — Ambulatory Visit: Payer: Medicare PPO | Admitting: Neurology

## 2021-05-08 VITALS — BP 125/70 | HR 99 | Ht 74.0 in | Wt 224.5 lb

## 2021-05-08 DIAGNOSIS — R200 Anesthesia of skin: Secondary | ICD-10-CM | POA: Diagnosis not present

## 2021-05-08 DIAGNOSIS — G6181 Chronic inflammatory demyelinating polyneuritis: Secondary | ICD-10-CM | POA: Diagnosis not present

## 2021-05-08 NOTE — Progress Notes (Signed)
[facility]  PATIENT: Douglas Roberts. DOB: 03-05-1956  REFERRING DOCTOR OR PCP: Barnett Abu, MD (neurosurgery) Catha Gosselin, MD (PCP) SOURCE: Patient, note from Dr. Danielle Dess  _________________________________   HISTORICAL  CHIEF COMPLAINT:  Chief Complaint  Patient presents with  . Follow-up    RM 13, alone.  Last seen 03/15/21. Recently placed on prednisone 04/12/21 d/t increased protein found in CSF. He has noticed no improvement since starting this.      HISTORY OF PRESENT ILLNESS:  Douglas Roberts, is a 65 y.o. man with progressive foot and leg numbness.    UPDATE 05/08/2021: Since the last visit, he had an LP.  CSF showed mildly elevated protein of 70 mg/dL (normal < 60).   He was placed on prednisone 60 mg po daily  Numbness is exclusively in the feet and toes and not above the ankles.     The sensation is numbness with tingling.  He also feels his balance is reduced but he can shampoo hair/close eyes without holding on for support.   Balance is worse when he is carrying an item.   Or going downstairs.  He denies weakness.    While sitting he does not note symptoms.   He denies change in his bladder.    He does not have diabetes.  He denies dry mouth or dry eyes.   No history of autoimmune or neoplastic disorder.   No history of chemotherapy.   He has had surgery on his back in 2018 (multilevel laminotomies) and 2019 (fusion from L2-L5)  NEUROPATHY HISTORY: Neuropathy symptoms began about 5 years ago and have steadily progressed.  He has numbness in the feet up to the ankles.  When standing he notes some tingling superimposed.  B12, SPEP, SSA/SSB, RF were negative.  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  Features are demyelinating greater than axonal in the legs though the arms were fairly normal.  CSF 03/2021 showed elevated protein.  Imaging: MRI 03/30/2019 shows PLIF at L2-L5 with  subsequent improvement of spinal stenosis at each of these levels.  However, there is a new disc protrusion to the right at L5S1 that could affect right S1.  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  At L3-L4 there is retrolisthesis and a disc protrusion more to the right with probable right L4 nerve root compression.  At L4-L5, there is a large posterior synovial cyst and other degenerative change contributing to severe spinal stenosis.  There is bilateral foraminal and lateral recess stenosis that could affect the exiting and traversing nerve roots.  At L5-S1 there is mild retrolisthesis and other degenerative changes with some encroachment upon the S1 nerve root more than the L5 nerve roots    REVIEW OF SYSTEMS: Constitutional: No fevers, chills, sweats, or change in appetite Eyes: No visual changes, double vision, eye pain Ear, nose and throat: No hearing loss, ear pain, nasal congestion, sore throat Cardiovascular: No chest pain, palpitations Respiratory: No shortness of breath at rest or with exertion.   No wheezes GastrointestinaI: No nausea, vomiting, diarrhea, abdominal pain, fecal incontinence Genitourinary: No dysuria, urinary retention or frequency.  No nocturia. Musculoskeletal: No neck pain, back pain Integumentary: No rash, pruritus, skin lesions Neurological: as above Psychiatric: No depression at this time.  No anxiety Endocrine: No palpitations, diaphoresis, change  in appetite, change in weigh or increased thirst Hematologic/Lymphatic: No anemia, purpura, petechiae. Allergic/Immunologic: No itchy/runny eyes, nasal congestion, recent allergic reactions, rashes  ALLERGIES: Allergies  Allergen Reactions  . Bee Venom Anaphylaxis    HOME MEDICATIONS:  Current Outpatient Medications:  .  atenolol (TENORMIN) 25 MG tablet, Take  25 mg by mouth every evening. , Disp: , Rfl:  .  Cyanocobalamin (VITAMIN B 12 PO), Take by mouth., Disp: , Rfl:  .  docusate sodium (COLACE) 100 MG capsule, Take 100 mg by mouth daily. , Disp: , Rfl:  .  doxazosin (CARDURA) 2 MG tablet, Take 2 mg by mouth daily at 6 PM., Disp: , Rfl:  .  Fiber CAPS, Take 3 capsules by mouth 2 (two) times daily., Disp: , Rfl:  .  fish oil-omega-3 fatty acids 1000 MG capsule, Take 2 g by mouth daily at 6 PM. , Disp: , Rfl:  .  ibuprofen (ADVIL,MOTRIN) 200 MG tablet, Take 400 mg by mouth as needed (for pain.)., Disp: , Rfl:  .  pravastatin (PRAVACHOL) 40 MG tablet, Take 40 mg by mouth daily at 6 PM. , Disp: , Rfl:  .  predniSONE (DELTASONE) 20 MG tablet, Take 3 tablets (60 mg total) by mouth daily., Disp: 100 tablet, Rfl: 3 .  Probiotic Product (PROBIOTIC PO), Take 1 capsule by mouth daily at 6 (six) AM., Disp: , Rfl:  .  Saw Palmetto 450 MG CAPS, Take 450 mg by mouth daily at 6 PM., Disp: , Rfl:   PAST MEDICAL HISTORY: Past Medical History:  Diagnosis Date  . Hyperlipemia   . Hypertension   . Sleep apnea    no CPAP    PAST SURGICAL HISTORY: Past Surgical History:  Procedure Laterality Date  . ADENOIDECTOMY    . ANTERIOR LAT LUMBAR FUSION N/A 11/11/2018   Procedure: Lumbar Two-Three, Lumbar Three-Four, Lumbar Four-Five Anterolateral decompression/interbody fusion with posterior percutaneous arthrodesis Lumbar Two to Lumbar Five with Mazor;  Surgeon: Barnett Abu, MD;  Location: [facility] OR;  Service: Neurosurgery;  Laterality: N/A;  Four-Five Anterolateral decompression/interbody fusion with posterior percutaneous arthrodesis Lumbar Two to Lumbar Five wi  . APPLICATION OF ROBOTIC ASSISTANCE FOR SPINAL PROCEDURE N/A 11/11/2018   Procedure: APPLICATION OF ROBOTIC ASSISTANCE FOR SPINAL PROCEDURE;  Surgeon: Barnett Abu, MD;  Location: [facility] OR;  Service: Neurosurgery;  Laterality: N/A;  APPLICATION OF ROBOTIC ASSISTANCE FOR SPINAL PROCEDURE  . COLONOSCOPY     x2  .  LUMBAR LAMINECTOMY/DECOMPRESSION MICRODISCECTOMY Bilateral 10/15/2017   Procedure: Bilateral Lumbar Two- Three Lumbar Three- Four Lumbar Four- Five Laminectomy/Foraminotomy;  Surgeon: Barnett Abu, MD;  Location: [facility] OR;  Service: Neurosurgery;  Laterality: Bilateral;  Bilateral L2-3 L3-4 L4-5 Laminectomy/Foraminotomy  . LUMBAR PERCUTANEOUS PEDICLE SCREW 3 LEVEL N/A 11/11/2018   Procedure: Lumbar Two-Three, Lumbar Three-Four, Lumbar Four-Five Lumbar Percutaneous Pedicle Screw;  Surgeon: Barnett Abu, MD;  Location: [facility] OR;  Service: Neurosurgery;  Laterality: N/A;  Lumbar Two-Three, Lumbar Three-Four, Lumbar Four-Five Lumbar Percutaneous Pedicle Screw     FAMILY HISTORY: Family History  Problem Relation Age of Onset  . Hypertension Mother   . Dementia Mother   . Dementia Father   . Parkinson's disease Father     SOCIAL HISTORY:  Social History   Socioeconomic History  . Marital status: Married    Spouse name: Not on file  . Number of children: Not on file  . Years of education: Not on file  . Highest education level: Not on file  Occupational History  . Not on  file  Tobacco Use  . Smoking status: Never Smoker  . Smokeless tobacco: Never Used  Vaping Use  . Vaping Use: Never used  Substance and Sexual Activity  . Alcohol use: Yes    Comment: 5oz per day  . Drug use: No  . Sexual activity: Not on file  Other Topics Concern  . Not on file  Social History Narrative   Right handed    Caffeine use: Tea sometimes, decaf coffee and tea. Rarely drinks soda   Social Determinants of Health   Financial Resource Strain: Not on file  Food Insecurity: Not on file  Transportation Needs: Not on file  Physical Activity: Not on file  Stress: Not on file  Social Connections: Not on file  Intimate Partner Violence: Not on file     PHYSICAL EXAM  Vitals:   05/08/21 1431  BP: 125/70  Pulse: 99  SpO2: 96%  Weight: 224 lb 8 oz (101.8 kg)  Height: 6\' 2"  (1.88 m)    Body mass  index is 28.82 kg/m.   General: The patient is well-developed and well-nourished and in no acute distress  HEENT:  Head is [place]/AT.  Sclera are anicteric.   Skin: Extremities are without rash or  edema.  Neurologic Exam  Mental status: The patient is alert and oriented x 3 at the time of the examination. The patient has apparent normal recent and remote memory, with an apparently normal attention span and concentration ability.   Speech is normal.  Cranial nerves: Extraocular movements are full.  Facial strength and sensation was normal.  No obvious hearing deficits are noted.  Motor:  Muscle bulk is normal.   Tone is normal. Strength is  5 / 5 in all 4 extremities.   Sensory: He has normal vibration sensation in arms and knees but reduced (10% right and 20% left) vibration sensation at ankles and toes (10%).   Pinprick reduced right S1 but normal elsewhere in feet.    Coordination: Cerebellar testing reveals good finger-nose-finger and heel-to-shin bilaterally.  Gait and station: Station is normal.   Gait is normal. Tandem gait is normal. Romberg is negative.   Reflexes: Deep tendon reflexes are symmetric and normal in arms and knees and trace at the ankles.   Plantar responses are flexor.    DIAGNOSTIC DATA (LABS, IMAGING, TESTING) - I reviewed patient records, labs, notes, testing and imaging myself where available.  Lab Results  Component Value Date   WBC 6.3 05/04/2020   HGB 14.9 05/04/2020   HCT 45.2 05/04/2020   MCV 100.7 (H) 05/04/2020   PLT 184 05/04/2020      Component Value Date/Time   NA 140 05/04/2020 0144   K 4.6 05/04/2020 0144   CL 99 05/04/2020 0144   CO2 25 05/04/2020 0144   GLUCOSE 89 05/04/2020 0144   BUN 7 (L) 05/04/2020 0144   CREATININE 0.82 05/04/2020 0144   CALCIUM 9.5 05/04/2020 0144   PROT 6.6 03/15/2021 1111   GFRNONAA >60 05/04/2020 0144   GFRAA >60 05/04/2020 0144   No results found for: CHOL, HDL, LDLCALC, LDLDIRECT, TRIG, CHOLHDL Lab  Results  Component Value Date   HGBA1C 4.8 06/14/2020   Lab Results  Component Value Date   VITAMINB12 705 06/14/2020   Lab Results  Component Value Date   TSH 1.720 06/14/2020       ASSESSMENT AND PLAN  CIDP (chronic inflammatory demyelinating polyneuropathy) (HCC)  Numbness  1.   Continue prednisone 60 mg daily.  If no better in another 2 weeks, set up IVIg --- 200 g IV over 2 days every month 2.    Stay active and exercise as tolerated 3.    rtc 4 month  Mychelle Kendra A. Epimenio Foot, MD, [facility] 05/08/2021, 3:18 PM Certified in Neurology, Clinical Neurophysiology, Sleep Medicine and Neuroimaging  [facility] [address], [address] [place], [place] [address] [phone]

## 2021-05-16 ENCOUNTER — Telehealth: Payer: Self-pay | Admitting: Neurology

## 2021-05-16 NOTE — Telephone Encounter (Signed)
Pt has called to report that the  predniSONE (DELTASONE) 20 MG tablet has not helped him at all, please call.

## 2021-05-18 ENCOUNTER — Other Ambulatory Visit: Payer: Self-pay | Admitting: Neurology

## 2021-05-18 DIAGNOSIS — G6181 Chronic inflammatory demyelinating polyneuritis: Secondary | ICD-10-CM

## 2021-05-18 DIAGNOSIS — Z79899 Other long term (current) drug therapy: Secondary | ICD-10-CM

## 2021-05-18 NOTE — Telephone Encounter (Signed)
Patient called back and would also like instruction on how to taper off the prednisone.  He has been on it since 04/12/21.  Has had no improvement of symptoms.    Would like to begin the IVIG.

## 2021-05-18 NOTE — Telephone Encounter (Signed)
LVM for patient to return call. 

## 2021-05-18 NOTE — Telephone Encounter (Signed)
Please have him drop the prednisone to 40 mg a day for one week, 30 mg a day for the next week, then 20 mg a day.   We will need to go slower from there.  I will request the IVIg

## 2021-05-18 NOTE — Telephone Encounter (Signed)
Attempted to call patient again today.  Unable to leave message due to "mailbox is full"

## 2021-05-19 NOTE — Telephone Encounter (Signed)
Pt called, Prednisone is not working. Would like a call from the nurse.

## 2021-05-22 ENCOUNTER — Telehealth: Payer: Self-pay | Admitting: Neurology

## 2021-05-22 NOTE — Telephone Encounter (Signed)
Took call from phone staff and spoke with pt. Relayed below message. He is agreeable to plan. He wrote down directions and read tapering instructions back correctly. He is aware infusion suite working on approval for IVIG and will reach out to schedule once able.

## 2021-05-22 NOTE — Telephone Encounter (Signed)
LVM for patient to return call. 

## 2021-05-22 NOTE — Telephone Encounter (Signed)
Attempted to call back again x2 today.  Unable to leave message due to 'mailbox full"

## 2021-05-22 NOTE — Telephone Encounter (Signed)
Pt has called to report that the   predniSONE (DELTASONE) 20 MG tablet has not helped.  Pt would like to know how to taper off the Prednisone.  pt would also liked to be called to be scheduled for IVIG

## 2021-05-22 NOTE — Telephone Encounter (Addendum)
Tried calling pt back. Rang once, went to VM. Mailbox full, unable to leave message. EK,RN has also been trying to reach pt in the last week. See other messages.  Per Dr. Epimenio Foot: "Please have him drop the prednisone to 40 mg a day for one week, 30 mg a day for the next week, then 20 mg a day.   We will need to go slower from there. I will request the IVIG"  I spoke w/ Liane 05/22/21 and they just received order for IVIG 05/18/21. It is still in insurance approval process. They will reach out to pt to schedule once they get it approved.

## 2021-05-29 NOTE — Telephone Encounter (Signed)
LVM for pt letting him know we will be unable to do a Dementia screening test when he comes for IVIG. This is an infusion appt, not OV. He will also be sitting around other pt. We will be more than happy to include this at his next visit per MD.

## 2021-05-29 NOTE — Telephone Encounter (Signed)
Pt called and LVM wanting to know if he can get a Dementia screening test on the same day he gets his IVIG. Please advise.

## 2021-06-07 NOTE — Telephone Encounter (Signed)
Patient called & was sent to my VM, he LVM stating he received a letter from his insurance company stating IVIG is approved. He would like a CB to schedule.

## 2021-06-07 NOTE — Telephone Encounter (Signed)
Notified infusion suite and asked they follow up with pt to provide an update on getting scheduled for IVIG.

## 2021-06-07 NOTE — Telephone Encounter (Signed)
Spoke w/ infusion suite. Pt scheduled for IVIG 07/03/21 and 07/04/21.

## 2021-06-12 ENCOUNTER — Telehealth: Payer: Self-pay | Admitting: Neurology

## 2021-06-12 NOTE — Telephone Encounter (Addendum)
Spoke w/ Dr. Epimenio Foot. He recommends the following: Take prednisone 20mg -10mg  qod for 8 days then 10 /day until first IVIG infusion. We will do dementia screening test at next follow up.  LVM for pt to call office back

## 2021-06-12 NOTE — Telephone Encounter (Signed)
Pt called, when do I stop taking the Prednisone and can physician administer a Dementia screening test while I'm there for IVIG appt. Would like a call from the nurse.

## 2021-06-13 NOTE — Telephone Encounter (Signed)
Tried calling pt, went straight to VM. LVM for pt to call office.

## 2021-06-14 NOTE — Telephone Encounter (Signed)
Tried calling pt again, went straight to VM. I LVM for pt to call back.

## 2021-06-14 NOTE — Telephone Encounter (Signed)
Took call from phone staff and spoke w/ pt. Advised per Dr. Epimenio Foot: He should take prednisone 20mg  one day then 10mg  the other for 8 days starting 06/14/21. Then go to 10mg  qd until first IVIG infusion on 07/03/21. Dr. 06/16/21 will speak w/ him then on next steps for prednisone. We will do cognitive screening at next follow up appt. He verbalized understanding and appreciation. He states he did not have VM's from me. Unsure what is going on w/ his phone.

## 2021-07-03 ENCOUNTER — Telehealth: Payer: Self-pay | Admitting: Neurology

## 2021-07-03 ENCOUNTER — Other Ambulatory Visit: Payer: Self-pay | Admitting: Neurology

## 2021-07-03 MED ORDER — PREDNISONE 5 MG PO TABS: ORAL_TABLET | ORAL | 0 refills | Status: DC

## 2021-07-03 NOTE — Telephone Encounter (Signed)
Pt called wanting to know if he can stop taking the predniSONE (DELTASONE) 20 MG tablet. Pt requesting a call back.

## 2021-07-04 ENCOUNTER — Encounter: Payer: Self-pay | Admitting: Neurology

## 2021-07-04 NOTE — Telephone Encounter (Signed)
Called the pt back. There was no answer. LVM advising I would send instructions through mychart for him and if he needs to call back he can.

## 2021-07-12 ENCOUNTER — Telehealth: Payer: Self-pay | Admitting: Neurology

## 2021-07-12 NOTE — Telephone Encounter (Signed)
Orders originally sent on 07/06/[facility]21 to [facility] for them to get the patient set up on infusion at home.  Called the pharmacy today to verify if they had received the orders and they had stated that they had not. I have resent the order and received a call back advising that the orders did go through this time. They advised they would contact us if they needed anything else from our office.

## 2021-07-12 NOTE — Telephone Encounter (Signed)
Received a message from infusion suite that the patient had called and left a message.  Called the patient back to get more information. There was no answer. Left a detailed message advising the patient that I had forwarded orders to [facility] for them to get the infusions set up closer to his location/in his home.   *If patient calls back, let him know I had sent orders and insurance info to [facility] for them to get his infusions set up.  If there is something else needed let me know.

## 2021-07-17 NOTE — Telephone Encounter (Signed)
Pt returned call today stating that he was wanting to complete the infusions at the [facility] in [place]. Pt states that he has already contacted someone in their radiology dept who can get him set up but they needed the orders. Pt provided me with fax number [phone] to send the records to. Sent the information over for the patient.

## 2021-07-19 NOTE — Telephone Encounter (Signed)
Called back and spoke w/ Marchelle Folks. Verified we received form. Once MD signs, we will fax back. She verbalized understanding.

## 2021-07-19 NOTE — Telephone Encounter (Signed)
Spoke with Liane at [facility] infusion. Stated they are faxing something over for Dr. Epimenio Foot to fill out and wanted to know if we had received it.  Let her know I didn't see any notes in the patients chart but In would reach out to see if it has been received as I don't handle the Prior Authorizations. Verified fax number. Her call back number is [phone].

## 2021-07-20 NOTE — Telephone Encounter (Addendum)
Liane in the infusion suite here spoke w/ [facility] infusion center. They did not receive referral. We refaxed. She confirmed receipt. Requesting PA be completed. There is already [place] on file 06/01/21-12/16/21. Just needing to update site. I called pt insurance at [phone]. Spoke w/ Fayrene Fearing. Call ref# [id number]. He transferred me to medical intake team (phone: [phone]). Spoke w/ Yvone Neu. States I need to speak w/ internal team. She transferred me to [place]. I provided new infusion site info:  [facility] Phone: [phone] Fax: [phone] NPI: [id number], Tax ID: [id number] Address: [address] [place], [place] [address] Procedure code: W9794  Ref# for call: [id number] Approved same auth#/DOS  They will fax update to [place] at [phone]. We can then fax this update to [facility] infusion site.

## 2021-07-24 NOTE — Telephone Encounter (Signed)
Received an approval letter from [facility] stating that [employer] is approved for the patient 06/01/21-12/16/2021.

## 2021-08-10 ENCOUNTER — Other Ambulatory Visit: Payer: Self-pay | Admitting: Neurology

## 2021-09-14 ENCOUNTER — Telehealth: Payer: Medicare PPO | Admitting: Neurology

## 2021-09-14 ENCOUNTER — Encounter: Payer: Self-pay | Admitting: Neurology

## 2021-09-14 DIAGNOSIS — Z79899 Other long term (current) drug therapy: Secondary | ICD-10-CM | POA: Diagnosis not present

## 2021-09-14 DIAGNOSIS — R202 Paresthesia of skin: Secondary | ICD-10-CM

## 2021-09-14 DIAGNOSIS — R200 Anesthesia of skin: Secondary | ICD-10-CM | POA: Diagnosis not present

## 2021-09-14 DIAGNOSIS — G6181 Chronic inflammatory demyelinating polyneuritis: Secondary | ICD-10-CM | POA: Diagnosis not present

## 2021-09-14 NOTE — Progress Notes (Signed)
[facility]  PATIENT: Douglas Roberts. DOB: August 24, 1956  REFERRING DOCTOR OR PCP: Barnett Abu, MD (neurosurgery) Catha Gosselin, MD (PCP) SOURCE: Patient, note from Dr. Danielle Dess  _________________________________   HISTORICAL  CHIEF COMPLAINT:  Chief Complaint  Patient presents with   Peripheral Neuropathy    On IVIg       HISTORY OF PRESENT ILLNESS:  Iley Breeden, is a 65 y.o. man with progressive foot and leg numbness.     Virtual Visit via Video Note I connected with Douglas Roberts. on 09/14/21 at  1:00 PM EDT by a video enabled telemedicine application and verified that I am speaking with the correct person.  I discussed the limitations of evaluation and management by telemedicine and the availability of in person appointments. The patient expressed understanding and agreed to proceed.  Patient was at home.   Provider in office.   UPDATE 09/14/21: CSF showed mildly elevated protein of 70 mg/dL (normal < 60).   He was initially placed on prednisone 60 mg po daily.  As symptoms persisted he was started on IVIg  - he just had his 3rd treatment last week.   Numbness is mildly better.  Balance is fine.    Feet and legs are strong.    We had done a slow steroid taper and he is now off.     Numbness is exclusively in the feet and toes and not above the ankles.     The sensation is numbness with tingling.  He also feels his balance is reduced but he can shampoo hair/close eyes without holding on for support.   Balance is worse when he is carrying an item.   Or going downstairs.  He denies weakness.    While sitting he does not note symptoms.   He denies change in his bladder.     He does not have diabetes.  He denies dry mouth or dry eyes.   No history of autoimmune or neoplastic disorder.   No history of chemotherapy.   He has had surgery on his back in 2018 (multilevel laminotomies) and 2019 (fusion from L2-L5)  Physical Exam today: He is a well-developed  well-nourished man in no acute distress.  The head is normocephalic and atraumatic.  Sclera are anicteric . Visible skin was normal.  .  He is alert and fully oriented with fluent speech and good attention, knowledge and memory.  Extraocular muscles are intact.  Facial strength is normal.  Palatal elevation and tongue protrusion are midline.  He appears to have normal strength in the arms.  Rapid alternating movements and finger-nose-finger are performed well.    NEUROPATHY HISTORY: Neuropathy symptoms began about 5 years ago and have steadily progressed.  He has numbness in the feet up to the ankles.  When standing he notes some tingling superimposed.  B12, SPEP, SSA/SSB, RF were negative.  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  Features are demyelinating greater than axonal in the legs though the arms were fairly normal.  CSF 03/2021 showed elevated protein.  Initially placed on prednisone but had no improvement.  IVIG started 06/2021.    Imaging: MRI 03/30/2019 shows PLIF at L2-L5 with subsequent improvement of spinal stenosis at each of these levels.  However, there is a new disc protrusion to the right at L5S1 that could affect right S1.  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  At L3-L4 there is retrolisthesis and a disc protrusion more to the right with probable right L4 nerve root compression.  At L4-L5, there is a large posterior synovial cyst and other degenerative change contributing to severe spinal stenosis.  There is bilateral foraminal and lateral recess stenosis that could affect the exiting and traversing nerve roots.  At L5-S1 there is mild retrolisthesis and other degenerative changes with some encroachment upon the S1 nerve root more than the L5 nerve roots    REVIEW OF  SYSTEMS: Constitutional: No fevers, chills, sweats, or change in appetite Eyes: No visual changes, double vision, eye pain Ear, nose and throat: No hearing loss, ear pain, nasal congestion, sore throat Cardiovascular: No chest pain, palpitations Respiratory:  No shortness of breath at rest or with exertion.   No wheezes GastrointestinaI: No nausea, vomiting, diarrhea, abdominal pain, fecal incontinence Genitourinary:  No dysuria, urinary retention or frequency.  No nocturia. Musculoskeletal:  No neck pain, back pain Integumentary: No rash, pruritus, skin lesions Neurological: as above Psychiatric: No depression at this time.  No anxiety Endocrine: No palpitations, diaphoresis, change in appetite, change in weigh or increased thirst Hematologic/Lymphatic:  No anemia, purpura, petechiae. Allergic/Immunologic: No itchy/runny eyes, nasal congestion, recent allergic reactions, rashes  ALLERGIES: Allergies  Allergen Reactions   Bee Venom Anaphylaxis    HOME MEDICATIONS:  Current Outpatient Medications:    atenolol (TENORMIN) 25 MG tablet, Take 25 mg by mouth every evening. , Disp: , Rfl:    Cyanocobalamin (VITAMIN B 12 PO), Take by mouth., Disp: , Rfl:    docusate sodium (COLACE) 100 MG capsule, Take 100 mg by mouth daily. , Disp: , Rfl:    doxazosin (CARDURA) 2 MG tablet, Take 2 mg by mouth daily at 6 PM., Disp: , Rfl:    Fiber CAPS, Take 3 capsules by mouth 2 (two) times daily., Disp: , Rfl:    fish oil-omega-3 fatty acids 1000 MG capsule, Take 2 g by mouth daily at 6 PM. , Disp: , Rfl:    ibuprofen (ADVIL,MOTRIN) 200 MG tablet, Take 400 mg by mouth as needed (for pain.)., Disp: , Rfl:    pravastatin (PRAVACHOL) 40 MG tablet, Take 40 mg by mouth daily at 6 PM. , Disp: , Rfl:    Probiotic Product (PROBIOTIC PO), Take 1 capsule by mouth daily at 6 (six) AM., Disp: , Rfl:    Saw Palmetto 450 MG CAPS, Take 450 mg by mouth daily at 6 PM., Disp: , Rfl:   PAST MEDICAL HISTORY: Past Medical  History:  Diagnosis Date   Hyperlipemia    Hypertension    Sleep apnea    no CPAP    PAST SURGICAL HISTORY: Past Surgical History:  Procedure Laterality Date   ADENOIDECTOMY     ANTERIOR LAT LUMBAR FUSION N/A 11/11/2018   Procedure: Lumbar Two-Three, Lumbar Three-Four, Lumbar Four-Five Anterolateral decompression/interbody fusion with posterior percutaneous arthrodesis Lumbar Two to Lumbar Five with Mazor;  Surgeon: Barnett Abu, MD;  Location: [facility] OR;  Service: Neurosurgery;  Laterality: N/A;  Four-Five Anterolateral decompression/interbody fusion with posterior percutaneous arthrodesis Lumbar Two to Lumbar Five wi   APPLICATION OF ROBOTIC ASSISTANCE FOR SPINAL PROCEDURE N/A 11/11/2018   Procedure: APPLICATION OF ROBOTIC ASSISTANCE FOR SPINAL PROCEDURE;  Surgeon: Barnett Abu, MD;  Location: [facility] OR;  Service: Neurosurgery;  Laterality: N/A;  APPLICATION OF ROBOTIC ASSISTANCE FOR SPINAL PROCEDURE   COLONOSCOPY  x2   LUMBAR LAMINECTOMY/DECOMPRESSION MICRODISCECTOMY Bilateral 10/15/2017   Procedure: Bilateral Lumbar Two- Three Lumbar Three- Four Lumbar Four- Five Laminectomy/Foraminotomy;  Surgeon: Barnett Abu, MD;  Location: [facility] OR;  Service: Neurosurgery;  Laterality: Bilateral;  Bilateral L2-3 L3-4 L4-5 Laminectomy/Foraminotomy   LUMBAR PERCUTANEOUS PEDICLE SCREW 3 LEVEL N/A 11/11/2018   Procedure: Lumbar Two-Three, Lumbar Three-Four, Lumbar Four-Five Lumbar Percutaneous Pedicle Screw;  Surgeon: Barnett Abu, MD;  Location: [facility] OR;  Service: Neurosurgery;  Laterality: N/A;  Lumbar Two-Three, Lumbar Three-Four, Lumbar Four-Five Lumbar Percutaneous Pedicle Screw     FAMILY HISTORY: Family History  Problem Relation Age of Onset   Hypertension Mother    Dementia Mother    Dementia Father    Parkinson's disease Father     SOCIAL HISTORY:  Social History   Socioeconomic History   Marital status: Married    Spouse name: Not on file   Number of children: Not on file   Years of  education: Not on file   Highest education level: Not on file  Occupational History   Not on file  Tobacco Use   Smoking status: Never   Smokeless tobacco: Never  Vaping Use   Vaping Use: Never used  Substance and Sexual Activity   Alcohol use: Yes    Comment: 5oz per day   Drug use: No   Sexual activity: Not on file  Other Topics Concern   Not on file  Social History Narrative   Right handed    Caffeine use: Tea sometimes, decaf coffee and tea. Rarely drinks soda   Social Determinants of Health   Financial Resource Strain: Not on file  Food Insecurity: Not on file  Transportation Needs: Not on file  Physical Activity: Not on file  Stress: Not on file  Social Connections: Not on file  Intimate Partner Violence: Not on file     PHYSICAL EXAM from 05/08/2021  There were no vitals filed for this visit.   There is no height or weight on file to calculate BMI.   General: The patient is well-developed and well-nourished and in no acute distress  HEENT:  Head is [place]/AT.  Sclera are anicteric.   Skin: Extremities are without rash or  edema.  Neurologic Exam  Mental status: The patient is alert and oriented x 3 at the time of the examination. The patient has apparent normal recent and remote memory, with an apparently normal attention span and concentration ability.   Speech is normal.  Cranial nerves: Extraocular movements are full.  Facial strength and sensation was normal.  No obvious hearing deficits are noted.  Motor:  Muscle bulk is normal.   Tone is normal. Strength is  5 / 5 in all 4 extremities.   Sensory: He has normal vibration sensation in arms and knees but reduced (10% right and 20% left) vibration sensation at ankles and toes (10%).   Pinprick reduced right S1 but normal elsewhere in feet.    Coordination: Cerebellar testing reveals good finger-nose-finger and heel-to-shin bilaterally.  Gait and station: Station is normal.   Gait is normal. Tandem gait is  normal. Romberg is negative.   Reflexes: Deep tendon reflexes are symmetric and normal in arms and knees and trace at the ankles.   Plantar responses are flexor.    DIAGNOSTIC DATA (LABS, IMAGING, TESTING) - I reviewed patient records, labs, notes, testing and imaging myself where available.  Lab Results  Component Value Date   WBC 6.3 05/04/2020   HGB 14.9 05/04/2020  HCT 45.2 05/04/2020   MCV 100.7 (H) 05/04/2020   PLT 184 05/04/2020      Component Value Date/Time   NA 140 05/04/2020 0144   K 4.6 05/04/2020 0144   CL 99 05/04/2020 0144   CO2 25 05/04/2020 0144   GLUCOSE 89 05/04/2020 0144   BUN 7 (L) 05/04/2020 0144   CREATININE 0.82 05/04/2020 0144   CALCIUM 9.5 05/04/2020 0144   PROT 6.6 03/15/2021 1111   GFRNONAA >60 05/04/2020 0144   GFRAA >60 05/04/2020 0144   No results found for: CHOL, HDL, LDLCALC, LDLDIRECT, TRIG, CHOLHDL Lab Results  Component Value Date   HGBA1C 4.8 06/14/2020   Lab Results  Component Value Date   VITAMINB12 705 06/14/2020   Lab Results  Component Value Date   TSH 1.720 06/14/2020       ASSESSMENT AND PLAN  CIDP (chronic inflammatory demyelinating polyneuropathy) (HCC)  High risk medication use  Numbness and tingling of both feet  1.   Continue IVIg --- 200 g IV over 2 days every month 2.    Stay active and exercise as tolerated 3.    rtc 4 month   Follow Up Instructions: I discussed the assessment and treatment plan with the patient. The patient was provided an opportunity to ask questions and all were answered. The patient agreed with the plan and demonstrated an understanding of the instructions.    The patient was advised to call back or seek an in-person evaluation if the symptoms worsen or if the condition fails to improve as anticipated.  I provided 15 minutes of non-face-to-face time during this encounter.  Samayah Novinger A. Epimenio Foot, MD, [facility] 09/14/2021, 1:20 PM Certified in Neurology, Clinical Neurophysiology, Sleep  Medicine and Neuroimaging  [facility] [address] [place], [place] [address] [phone]

## 2021-10-05 ENCOUNTER — Telehealth: Payer: Self-pay | Admitting: *Deleted

## 2021-10-05 NOTE — Telephone Encounter (Signed)
Took call from phone staff and spoke with Efraim Kaufmann at [facility] where pt gets IVIG. States medication on national backorder until 10/28/21. I placed on hold and spoke w/ Dr. Epimenio Foot. Relayed per MD that it is ok to r/s his infusion until medication back in around 10/28/21.   Receives IVIG --- 200 g IV over 2 days every month

## 2022-01-15 ENCOUNTER — Encounter: Payer: Self-pay | Admitting: Neurology

## 2022-01-15 ENCOUNTER — Telehealth: Payer: Self-pay | Admitting: Neurology

## 2022-01-15 ENCOUNTER — Telehealth: Payer: Medicare PPO | Admitting: Neurology

## 2022-01-15 DIAGNOSIS — R200 Anesthesia of skin: Secondary | ICD-10-CM | POA: Diagnosis not present

## 2022-01-15 DIAGNOSIS — R269 Unspecified abnormalities of gait and mobility: Secondary | ICD-10-CM

## 2022-01-15 DIAGNOSIS — G6181 Chronic inflammatory demyelinating polyneuritis: Secondary | ICD-10-CM | POA: Diagnosis not present

## 2022-01-15 NOTE — Telephone Encounter (Signed)
Sent to [facility] ph # [phone]

## 2022-01-15 NOTE — Progress Notes (Addendum)
[facility]  PATIENT: Douglas Roberts. DOB: 1956/02/11  REFERRING DOCTOR OR PCP: Barnett Abu, MD (neurosurgery) Catha Gosselin, MD (PCP) SOURCE: Patient, note from Dr. Danielle Dess  _________________________________   HISTORICAL  CHIEF COMPLAINT:  Chief Complaint  Patient presents with   Numbness   Peripheral Neuropathy    HISTORY OF PRESENT ILLNESS:  Douglas Roberts, is a 66 y.o. man with progressive foot and leg numbness.     Virtual Visit via Video Note I connected with Douglas Roberts. on 01/15/22 at 11:30 AM EST by a video enabled telemedicine application and verified that I am speaking with the correct person.  I discussed the limitations of evaluation and management by telemedicine and the availability of in person appointments. The patient expressed understanding and agreed to proceed.  Patient was at home.   Provider in office.   UPDATE 01/15/22: He he continues to report numbness in his feet and poor gait.    He denies weakness.    He feel unstable.   He felt no benefit with steroids.     He tapered off prednisone and started IVIg in 06/2021.   He notes only minimal improvement x 2-3 days.   Nothing longer,    He is no worse.     He does the IVIg at [facility], [place], [place].     Numbness is in the toes greater than the feet and he denies numbness above the ankles.  This progressed over period time but has been stable over the 9 months.   The sensation is numbness with tingling.  He also feels his balance is reduced but he can shampoo hair/close eyes without holding on for support.   Balance is worse when he is carrying an item or going downstairs.  He is more concerned about the balance than the numbness.  He denies weakness.   He denies change in his bladder.     He does not have diabetes.  He denies dry mouth or dry eyes.   No history of autoimmune or neoplastic disorder.   No history of chemotherapy.   He has had surgery on his back in 2018  (multilevel laminotomies) and 2019 (fusion from L2-L5).    NEUROPATHY HISTORY: Neuropathy symptoms began about 5 years ago and have steadily progressed.  He has numbness in the feet up to the ankles.  When standing he notes some tingling superimposed.  B12, SPEP, SSA/SSB, RF were negative.  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  Features are demyelinating greater than axonal in the legs though the arms were fairly normal.  CSF 04/06/2021 showed elevated protein.    Initially placed on prednisone 60 mg after the CSF results but had no improvement after 3 months.  IVIG 2g/kg started 06/2021.  This was continued till January 2023 without benefit.  Initially he felt he had a slight benefit after the third dose but in retrospect he fear was there was no benefit.  Labs:   CSF 04/06/2021 showed mildly elevated protein of 70 mg/dL (normal < 60).     SPEP/IEF shows no paraprotein,,  B12, B1, RF, ssa/ssb were fine.  Heavy metal urine profile normal 06/14/2020  NCV/EMG 04/04/2022: This NCV/EMG study shows the following: 1.   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 2.   Left chronic S1 radiculopathy.  Imaging: MRI 03/30/2019 shows PLIF at L2-L5 with subsequent improvement of spinal stenosis at each of  these levels.  However, there is a new disc protrusion to the right at L5S1 that could affect right S1.  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  At L3-L4 there is retrolisthesis and a disc protrusion more to the right with probable right L4 nerve root compression.  At L4-L5, there is a large posterior synovial cyst and other degenerative change contributing to severe spinal stenosis.  There is bilateral foraminal and lateral recess stenosis that could affect  the exiting and traversing nerve roots.  At L5-S1 there is mild retrolisthesis and other degenerative changes with some encroachment upon the S1 nerve root more than the L5 nerve roots  Physical Exam today: He is a well-developed well-nourished man in no acute distress.  The head is normocephalic and atraumatic.  Sclera are anicteric . Visible skin was normal.  .  He is alert and fully oriented with fluent speech and good attention, knowledge and memory.  Extraocular muscles are intact.  Facial strength is normal.  Palatal elevation and tongue protrusion are midline.  He appears to have normal strength in the arms.  Rapid alternating movements and finger-nose-finger are performed well.  REVIEW OF SYSTEMS: Constitutional: No fevers, chills, sweats, or change in appetite Eyes: No visual changes, double vision, eye pain Ear, nose and throat: No hearing loss, ear pain, nasal congestion, sore throat Cardiovascular: No chest pain, palpitations Respiratory:  No shortness of breath at rest or with exertion.   No wheezes GastrointestinaI: No nausea, vomiting, diarrhea, abdominal pain, fecal incontinence Genitourinary:  No dysuria, urinary retention or frequency.  No nocturia. Musculoskeletal:  No neck pain, back pain Integumentary: No rash, pruritus, skin lesions Neurological: as above Psychiatric: No depression at this time.  No anxiety Endocrine: No palpitations, diaphoresis, change in appetite, change in weigh or increased thirst Hematologic/Lymphatic:  No anemia, purpura, petechiae. Allergic/Immunologic: No itchy/runny eyes, nasal congestion, recent allergic reactions, rashes  ALLERGIES: Allergies  Allergen Reactions   Bee Venom Anaphylaxis    HOME MEDICATIONS:  Current Outpatient Medications:    atenolol (TENORMIN) 25 MG tablet, Take 25 mg by mouth every evening. , Disp: , Rfl:    Cyanocobalamin (VITAMIN B 12 PO), Take by mouth., Disp: , Rfl:    docusate sodium (COLACE) 100 MG capsule,  Take 100 mg by mouth daily. , Disp: , Rfl:    doxazosin (CARDURA) 2 MG tablet, Take 2 mg by mouth daily at 6 PM., Disp: , Rfl:    Fiber CAPS, Take 3 capsules by mouth 2 (two) times daily., Disp: , Rfl:    fish oil-omega-3 fatty acids 1000 MG capsule, Take 2 g by mouth daily at 6 PM. , Disp: , Rfl:    ibuprofen (ADVIL,MOTRIN) 200 MG tablet, Take 400 mg by mouth as needed (for pain.)., Disp: , Rfl:    pravastatin (PRAVACHOL) 40 MG tablet, Take 40 mg by mouth daily at 6 PM. , Disp: , Rfl:    Probiotic Product (PROBIOTIC PO), Take 1 capsule by mouth daily at 6 (six) AM., Disp: , Rfl:    Saw Palmetto 450 MG CAPS, Take 450 mg by mouth daily at 6 PM., Disp: , Rfl:   PAST MEDICAL HISTORY: Past Medical History:  Diagnosis Date   Hyperlipemia    Hypertension    Sleep apnea    no CPAP    PAST SURGICAL HISTORY: Past Surgical History:  Procedure Laterality Date   ADENOIDECTOMY     ANTERIOR LAT LUMBAR FUSION N/A 11/11/2018   Procedure: Lumbar Two-Three, Lumbar Three-Four, Lumbar Four-Five Anterolateral decompression/interbody fusion with posterior percutaneous arthrodesis Lumbar Two to Lumbar Five with Mazor;  Surgeon: Barnett AbuElsner, Henry, MD;  Location: [facility] OR;  Service: Neurosurgery;  Laterality: N/A;  Four-Five Anterolateral decompression/interbody fusion with posterior percutaneous arthrodesis Lumbar Two to Lumbar Five wi   APPLICATION OF ROBOTIC ASSISTANCE FOR SPINAL PROCEDURE N/A 11/11/2018   Procedure: APPLICATION OF ROBOTIC ASSISTANCE FOR SPINAL PROCEDURE;  Surgeon: Barnett AbuElsner, Henry, MD;  Location: [facility] OR;  Service: Neurosurgery;  Laterality: N/A;  APPLICATION OF ROBOTIC ASSISTANCE FOR SPINAL PROCEDURE   COLONOSCOPY     x2   LUMBAR LAMINECTOMY/DECOMPRESSION MICRODISCECTOMY Bilateral 10/15/2017   Procedure: Bilateral Lumbar Two- Three Lumbar Three- Four Lumbar Four- Five Laminectomy/Foraminotomy;  Surgeon: Barnett AbuElsner, Henry, MD;  Location: [facility] OR;  Service: Neurosurgery;  Laterality: Bilateral;  Bilateral L2-3  L3-4 L4-5 Laminectomy/Foraminotomy   LUMBAR PERCUTANEOUS PEDICLE SCREW 3 LEVEL N/A 11/11/2018   Procedure: Lumbar Two-Three, Lumbar Three-Four, Lumbar Four-Five Lumbar Percutaneous Pedicle Screw;  Surgeon: Barnett AbuElsner, Henry, MD;  Location: [facility] OR;  Service: Neurosurgery;  Laterality: N/A;  Lumbar Two-Three, Lumbar Three-Four, Lumbar Four-Five Lumbar Percutaneous Pedicle Screw     FAMILY HISTORY: Family History  Problem Relation Age of Onset   Hypertension Mother    Dementia Mother    Dementia Father    Parkinson's disease Father     PHYSICAL EXAM from  (05/08/2021, last visit virtual) General: The patient is well-developed and well-nourished and in no acute distress  HEENT:  Head is [place]/AT.  Sclera are anicteric.   Skin: Extremities are without rash or  edema.  Neurologic Exam  Mental status: The patient is alert and oriented x 3 at the time of the examination. The patient has apparent normal recent and remote memory, with an apparently normal attention span and concentration ability.   Speech is normal.  Cranial nerves: Extraocular movements are full.  Facial strength and sensation was normal.  No obvious hearing deficits are noted.  Motor:  Muscle bulk is normal.   Tone is normal. Strength is  5 / 5 in all 4 extremities.   Sensory: He has normal vibration sensation in arms and knees but reduced (10% right and 20% left) vibration sensation at ankles and toes (10%).   Pinprick reduced right S1 but normal elsewhere in feet.    Coordination: Cerebellar testing reveals good finger-nose-finger and heel-to-shin bilaterally.  Gait and station: Station is normal.   Gait is normal. Tandem gait is normal. Romberg is negative.   Reflexes: Deep tendon reflexes are symmetric and normal in arms and knees and trace at the ankles.   Plantar responses are flexor.    DIAGNOSTIC DATA (LABS, IMAGING, TESTING) - I reviewed patient records, labs, notes, testing and imaging myself where available.  Lab  Results  Component Value Date   WBC 6.3 05/04/2020   HGB 14.9 05/04/2020   HCT 45.2 05/04/2020   MCV 100.7 (H) 05/04/2020   PLT 184 05/04/2020      Component Value Date/Time   NA 140 05/04/2020 0144   K 4.6 05/04/2020 0144   CL 99 05/04/2020 0144   CO2 25 05/04/2020 0144   GLUCOSE 89 05/04/2020 0144   BUN 7 (L) 05/04/2020 0144   CREATININE 0.82 05/04/2020 0144   CALCIUM 9.5 05/04/2020 0144   PROT 6.6 03/15/2021 1111   GFRNONAA >60 05/04/2020 0144   GFRAA >60 05/04/2020 0144  No results found for: CHOL, HDL, LDLCALC, LDLDIRECT, TRIG, CHOLHDL Lab Results  Component Value Date   HGBA1C 4.8 06/14/2020   Lab Results  Component Value Date   VITAMINB12 705 06/14/2020   Lab Results  Component Value Date   TSH 1.720 06/14/2020       ASSESSMENT AND PLAN  CIDP (chronic inflammatory demyelinating polyneuropathy) (HCC) - Plan: Ambulatory referral to Neurology  Gait disturbance - Plan: Ambulatory referral to Neurology  Numbness  1.   We will stop the IVIG as he had no benefit.  Although our working diagnosis was CIDP, the EMG/NCV showed a mixed demyelinating/axonal polyneuropathy and is possible he has an alternative diagnosis.  Labs were normal.  As we have not been able to provide a benefit, I would like him to see an academic center to help determine if another treatment might work better for him. 2.    Stay active and exercise as tolerated 3.    If neuropathic symptoms more rapidly progress advised to go [facility]  4.    rtc 4 month   Follow Up Instructions: I discussed the assessment and treatment plan with the patient. The patient was provided an opportunity to ask questions and all were answered. The patient agreed with the plan and demonstrated an understanding of the instructions.    The patient was advised to call back or seek an in-person evaluation if the symptoms worsen or if the condition fails to improve as anticipated.  I provided 25  minutes of non-face-to-face time during this encounter.  Charles Andringa A. Epimenio Foot, MD, [facility] 01/15/2022, 12:13 PM Certified in Neurology, Clinical Neurophysiology, Sleep Medicine and Neuroimaging  [facility] [address] [place], [place] [address] [phone]

## 2022-01-16 NOTE — Telephone Encounter (Signed)
Correction [facility] ph # [phone]

## 2022-04-05 IMAGING — CT CT HEAD W/O CM
4 series · 17 of 47 positions shown, 19 images · non-contrast
Comparison: None.

CLINICAL DATA: Dizziness

EXAM:
CT HEAD WITHOUT CONTRAST
TECHNIQUE: Contiguous axial images were obtained from the base of the skull
through the vertex without intravenous contrast.

[Series 3: head without · axial · non-contrast · 0.47mm/px · z∈[-186,-56]mm · 7 of 36 slices shown, 9 images]
[im 5/36  brain]
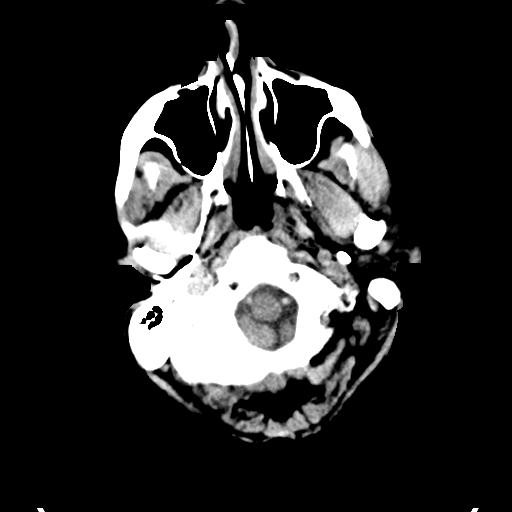
[im 5/36  bone]
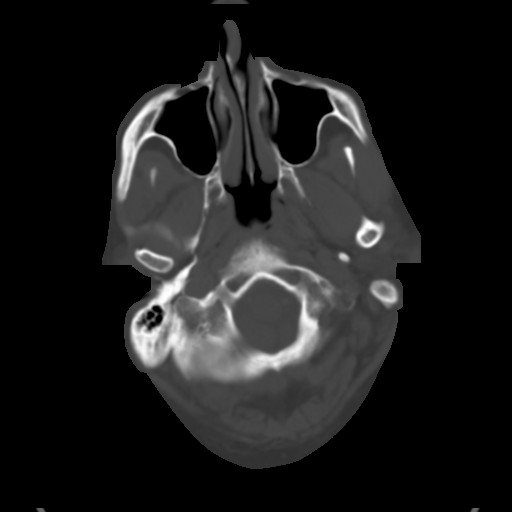
[im 9/36  brain]
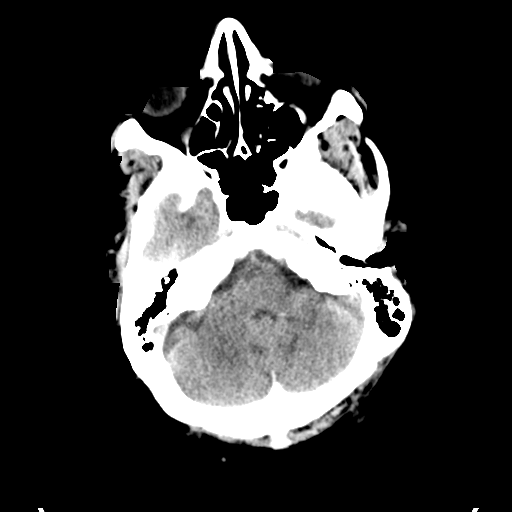
[im 14/36  brain]
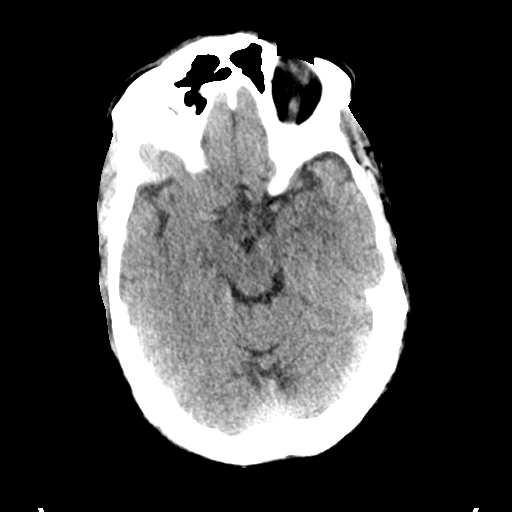
[im 18/36  brain]
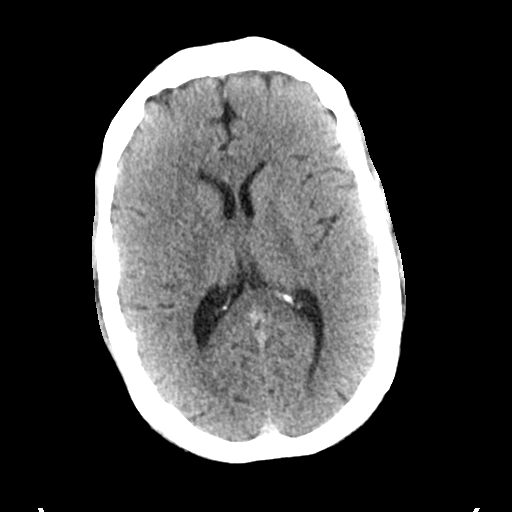
[im 22/36  brain]
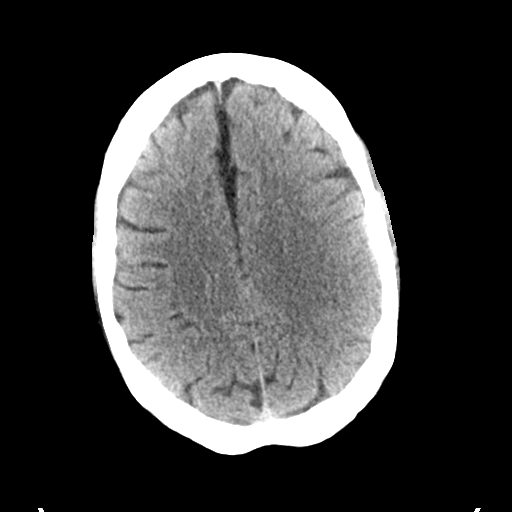
[im 22/36  bone]
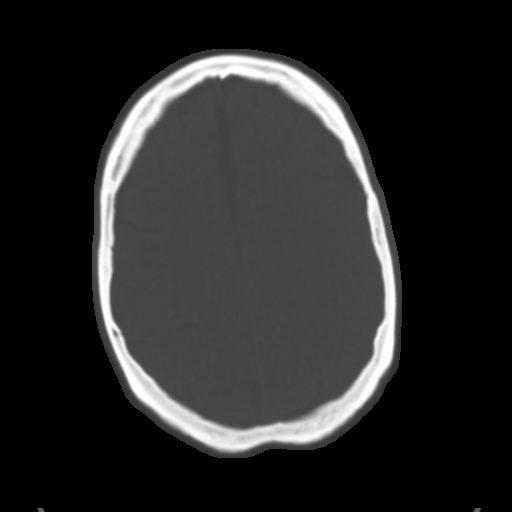
[im 27/36  brain]
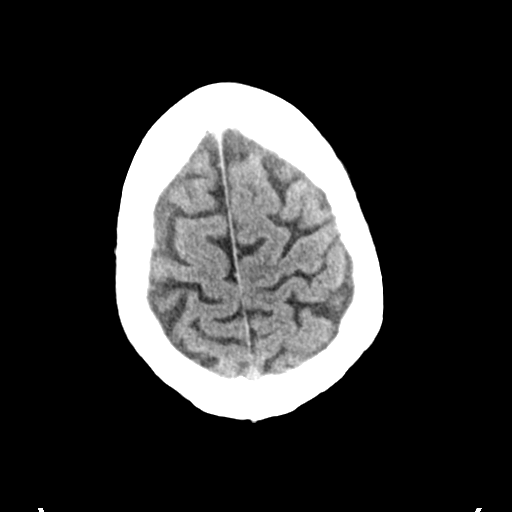
[im 31/36  brain]
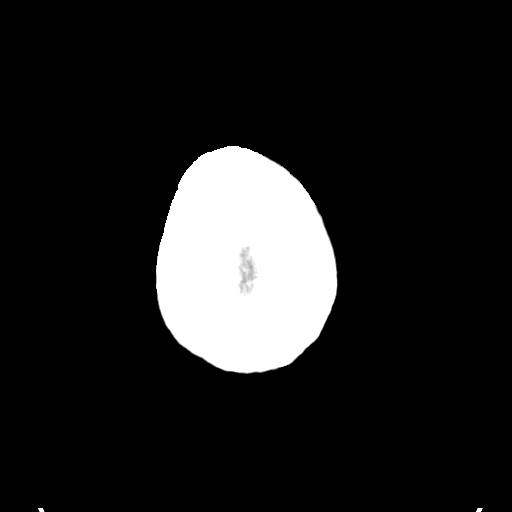

[Series 4: head bone · axial · 0.47mm/px · z∈[-190,-126]mm · 4 of 90 slices shown]
[im 9/90  bone]
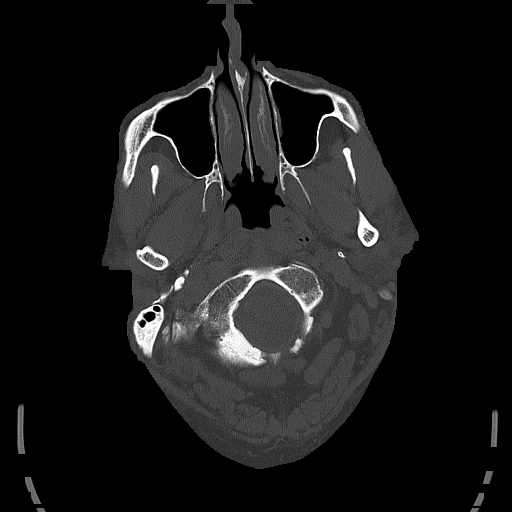
[im 18/90  bone]
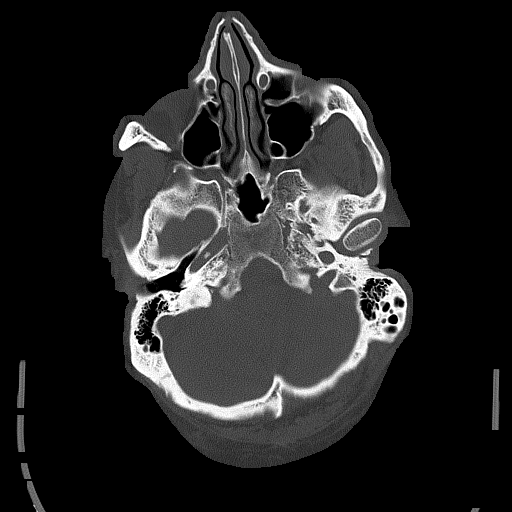
[im 27/90  bone]
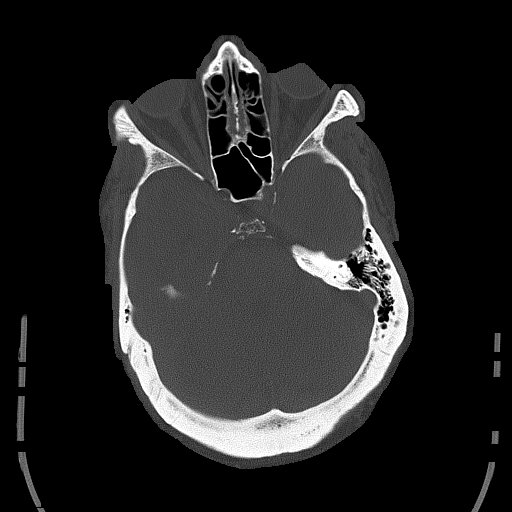
[im 41/90  bone]
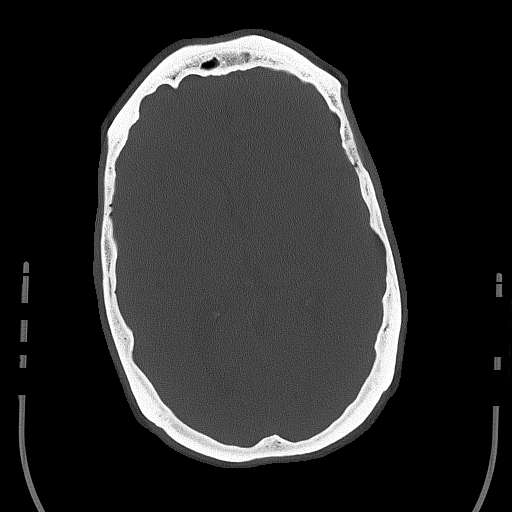

[Series 5: head without cor · coronal · non-contrast · 0.36mm/px · 3 of 76 slices shown]
[im 26/76  brain]
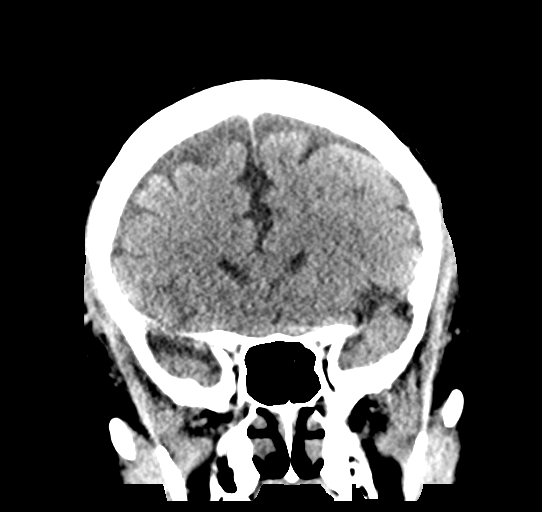
[im 34/76  brain]
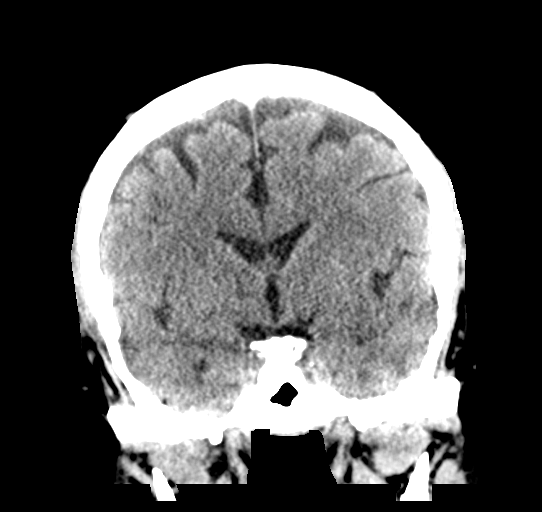
[im 42/76  brain]
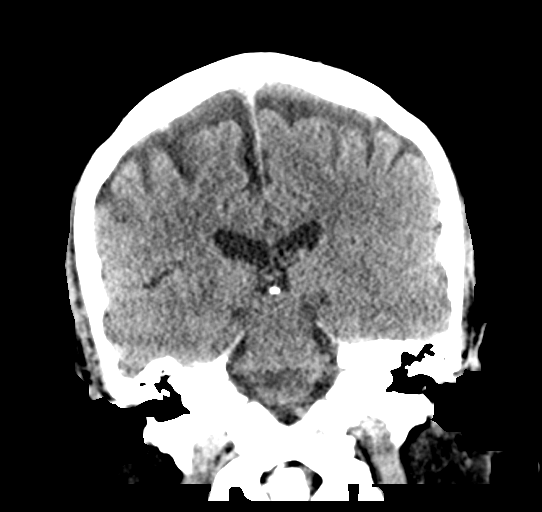

[Series 6: head without sag · sagittal · non-contrast · 0.36mm/px · 3 of 62 slices shown]
[im 21/62  brain]
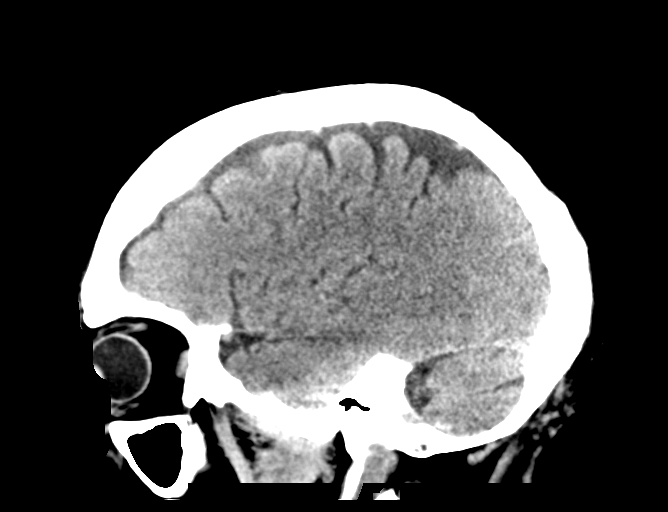
[im 31/62  brain]
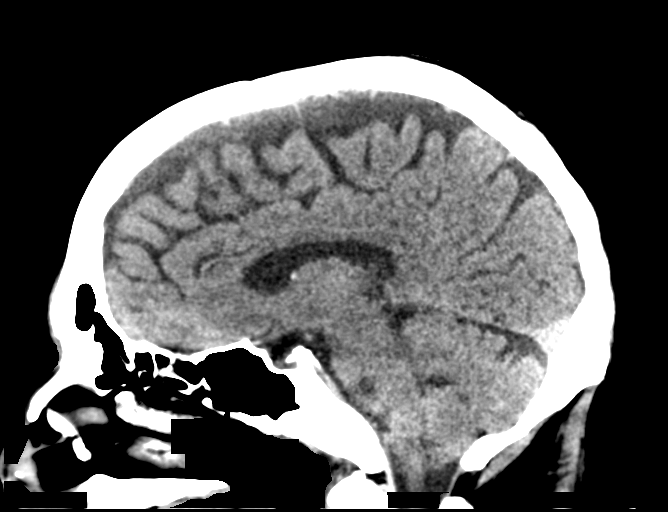
[im 41/62  brain]
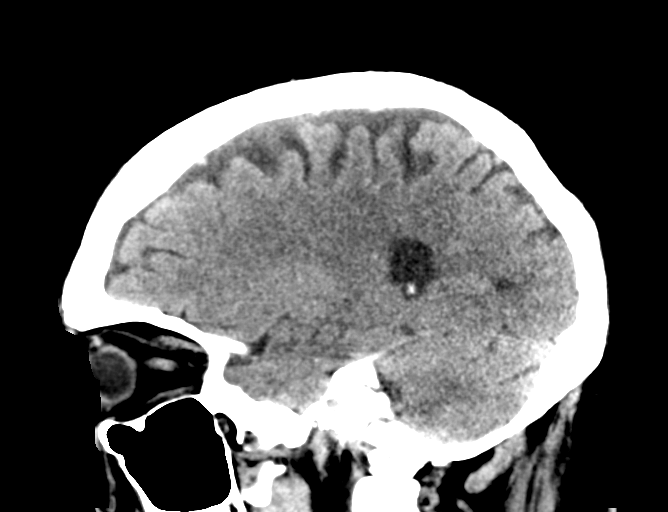

[17 of 47 positions shown; findings below may reference images not displayed]

FINDINGS: Brain: No acute territorial infarction, hemorrhage or intracranial
mass. The ventricles are nonenlarged.

Vascular: No hyperdense vessels. Scattered carotid vascular
calcification

Skull: Normal. Negative for fracture or focal lesion.

Sinuses/Orbits: Mild mucosal thickening in the sinuses

Other: None
IMPRESSION: Negative non contrasted CT appearance of the brain

## 2022-04-23 ENCOUNTER — Telehealth: Payer: Self-pay | Admitting: Neurology

## 2022-04-23 NOTE — Telephone Encounter (Signed)
Pt states he needs to schedule cognitive assessment test with Dr. Epimenio Foot.  ?He states this has been dicussed before with Dr. Epimenio Foot in previous appointments.  ?Would like to know if he can schedule this with a NP so he is not waiting until  an open appt in November.  ?Would like a call back to schedule.  ?

## 2022-04-23 NOTE — Telephone Encounter (Signed)
Called and spoke with pt. Scheduled appt with Dr. Felecia Shelling 05/09/22 at 3:30pm.  ?

## 2022-05-09 ENCOUNTER — Ambulatory Visit: Payer: Medicare PPO | Admitting: Neurology

## 2022-06-14 ENCOUNTER — Telehealth: Payer: Self-pay | Admitting: *Deleted

## 2022-06-14 NOTE — Telephone Encounter (Signed)
Pt call want lab results from 2022. [phone]

## 2022-06-14 NOTE — Telephone Encounter (Signed)
Called pt back at [phone].  He wanted to know if Hep C lab was checked previously. Reviewed and advised this was not checked. He verbalized understanding, nothing further needed.
